# Patient Record
Sex: Female | Born: 1975 | Race: White | Hispanic: No | Marital: Married | Smoking: Current every day smoker
Health system: Southern US, Community
[De-identification: ages and names within clinical notes are randomized; demographics above are authoritative.]

## PROBLEM LIST (undated history)

## (undated) DIAGNOSIS — A4902 Methicillin resistant Staphylococcus aureus infection, unspecified site: Secondary | ICD-10-CM

## (undated) DIAGNOSIS — M549 Dorsalgia, unspecified: Secondary | ICD-10-CM

## (undated) DIAGNOSIS — E079 Disorder of thyroid, unspecified: Secondary | ICD-10-CM

## (undated) DIAGNOSIS — N200 Calculus of kidney: Secondary | ICD-10-CM

## (undated) HISTORY — PX: PARATHYROIDECTOMY: SHX19

## (undated) HISTORY — PX: HAND SURGERY: SHX662

## (undated) HISTORY — PX: TUBAL LIGATION: SHX77

---

## 2009-12-08 ENCOUNTER — Emergency Department (HOSPITAL_COMMUNITY): Admission: EM | Admit: 2009-12-08 | Discharge: 2009-12-08 | Payer: Self-pay | Admitting: Emergency Medicine

## 2009-12-09 ENCOUNTER — Emergency Department (HOSPITAL_COMMUNITY): Admission: EM | Admit: 2009-12-09 | Discharge: 2009-12-09 | Payer: Self-pay | Admitting: Emergency Medicine

## 2009-12-11 ENCOUNTER — Inpatient Hospital Stay (HOSPITAL_COMMUNITY): Admission: EM | Admit: 2009-12-11 | Discharge: 2009-12-13 | Payer: Self-pay | Admitting: Emergency Medicine

## 2010-08-25 LAB — CBC
HCT: 36.8 % (ref 36.0–46.0)
Hemoglobin: 12.6 g/dL (ref 12.0–15.0)
Hemoglobin: 12.6 g/dL (ref 12.0–15.0)
Hemoglobin: 14.8 g/dL (ref 12.0–15.0)
MCH: 31.2 pg (ref 26.0–34.0)
MCH: 31.3 pg (ref 26.0–34.0)
MCH: 31.5 pg (ref 26.0–34.0)
MCHC: 34.3 g/dL (ref 30.0–36.0)
MCV: 90.9 fL (ref 78.0–100.0)
MCV: 91.4 fL (ref 78.0–100.0)
MCV: 91.5 fL (ref 78.0–100.0)
Platelets: 243 10*3/uL (ref 150–400)
Platelets: 255 10*3/uL (ref 150–400)
RBC: 4.05 MIL/uL (ref 3.87–5.11)
RBC: 4.68 MIL/uL (ref 3.87–5.11)
RDW: 13.2 % (ref 11.5–15.5)
RDW: 13.3 % (ref 11.5–15.5)

## 2010-08-25 LAB — DIFFERENTIAL
Basophils Absolute: 0.1 10*3/uL (ref 0.0–0.1)
Basophils Relative: 0 % (ref 0–1)
Basophils Relative: 1 % (ref 0–1)
Eosinophils Relative: 1 % (ref 0–5)
Lymphs Abs: 1.9 10*3/uL (ref 0.7–4.0)
Monocytes Absolute: 0.6 10*3/uL (ref 0.1–1.0)
Neutro Abs: 9.1 10*3/uL — ABNORMAL HIGH (ref 1.7–7.7)
Neutrophils Relative %: 59 % (ref 43–77)
Neutrophils Relative %: 78 % — ABNORMAL HIGH (ref 43–77)

## 2010-08-25 LAB — COMPREHENSIVE METABOLIC PANEL
ALT: 44 U/L — ABNORMAL HIGH (ref 0–35)
Albumin: 2.8 g/dL — ABNORMAL LOW (ref 3.5–5.2)
Alkaline Phosphatase: 53 U/L (ref 39–117)
CO2: 23 mEq/L (ref 19–32)
Potassium: 4.7 mEq/L (ref 3.5–5.1)
Total Bilirubin: 0.5 mg/dL (ref 0.3–1.2)

## 2010-08-25 LAB — POCT I-STAT, CHEM 8
Calcium, Ion: 1.44 mmol/L — ABNORMAL HIGH (ref 1.12–1.32)
Glucose, Bld: 98 mg/dL (ref 70–99)
HCT: 45 % (ref 36.0–46.0)
Hemoglobin: 15.3 g/dL — ABNORMAL HIGH (ref 12.0–15.0)
Potassium: 4 mEq/L (ref 3.5–5.1)

## 2010-08-25 LAB — CULTURE, BLOOD (ROUTINE X 2): Culture: NO GROWTH

## 2010-12-24 ENCOUNTER — Emergency Department (HOSPITAL_COMMUNITY)
Admission: EM | Admit: 2010-12-24 | Discharge: 2010-12-24 | Disposition: A | Discharge status: Home / Self Care | Payer: Self-pay | Attending: Emergency Medicine | Admitting: Emergency Medicine

## 2010-12-24 ENCOUNTER — Emergency Department (HOSPITAL_COMMUNITY): Payer: Self-pay

## 2010-12-24 DIAGNOSIS — G5600 Carpal tunnel syndrome, unspecified upper limb: Secondary | ICD-10-CM | POA: Insufficient documentation

## 2010-12-24 DIAGNOSIS — M25539 Pain in unspecified wrist: Secondary | ICD-10-CM | POA: Insufficient documentation

## 2011-04-16 ENCOUNTER — Encounter: Payer: Self-pay | Admitting: *Deleted

## 2011-04-16 ENCOUNTER — Emergency Department (HOSPITAL_COMMUNITY): Payer: Self-pay

## 2011-04-16 ENCOUNTER — Emergency Department (HOSPITAL_COMMUNITY)
Admission: EM | Admit: 2011-04-16 | Discharge: 2011-04-16 | Disposition: A | Discharge status: Home / Self Care | Payer: Self-pay | Attending: Emergency Medicine | Admitting: Emergency Medicine

## 2011-04-16 DIAGNOSIS — H9209 Otalgia, unspecified ear: Secondary | ICD-10-CM | POA: Insufficient documentation

## 2011-04-16 DIAGNOSIS — J3489 Other specified disorders of nose and nasal sinuses: Secondary | ICD-10-CM | POA: Insufficient documentation

## 2011-04-16 DIAGNOSIS — R0602 Shortness of breath: Secondary | ICD-10-CM | POA: Insufficient documentation

## 2011-04-16 DIAGNOSIS — R059 Cough, unspecified: Secondary | ICD-10-CM | POA: Insufficient documentation

## 2011-04-16 DIAGNOSIS — J351 Hypertrophy of tonsils: Secondary | ICD-10-CM | POA: Insufficient documentation

## 2011-04-16 DIAGNOSIS — R599 Enlarged lymph nodes, unspecified: Secondary | ICD-10-CM | POA: Insufficient documentation

## 2011-04-16 DIAGNOSIS — Z8614 Personal history of Methicillin resistant Staphylococcus aureus infection: Secondary | ICD-10-CM | POA: Insufficient documentation

## 2011-04-16 DIAGNOSIS — Z9889 Other specified postprocedural states: Secondary | ICD-10-CM | POA: Insufficient documentation

## 2011-04-16 DIAGNOSIS — R062 Wheezing: Secondary | ICD-10-CM | POA: Insufficient documentation

## 2011-04-16 DIAGNOSIS — F17200 Nicotine dependence, unspecified, uncomplicated: Secondary | ICD-10-CM | POA: Insufficient documentation

## 2011-04-16 DIAGNOSIS — R05 Cough: Secondary | ICD-10-CM | POA: Insufficient documentation

## 2011-04-16 DIAGNOSIS — J40 Bronchitis, not specified as acute or chronic: Secondary | ICD-10-CM | POA: Insufficient documentation

## 2011-04-16 HISTORY — DX: Methicillin resistant Staphylococcus aureus infection, unspecified site: A49.02

## 2011-04-16 LAB — RAPID STREP SCREEN (MED CTR MEBANE ONLY): Streptococcus, Group A Screen (Direct): NEGATIVE

## 2011-04-16 MED ORDER — ALBUTEROL SULFATE HFA 108 (90 BASE) MCG/ACT IN AERS: 1.0000 | INHALATION_SPRAY | Freq: Four times a day (QID) | RESPIRATORY_TRACT | Status: DC | PRN

## 2011-04-16 MED ORDER — ALBUTEROL SULFATE (5 MG/ML) 0.5% IN NEBU
2.5000 mg | INHALATION_SOLUTION | RESPIRATORY_TRACT | Status: DC
  Administered 2011-04-16: 2.5 mg via RESPIRATORY_TRACT
  Filled 2011-04-16: qty 1

## 2011-04-16 MED ORDER — IPRATROPIUM BROMIDE 0.02 % IN SOLN
0.5000 mg | RESPIRATORY_TRACT | Status: DC
  Administered 2011-04-16: 0.5 mg via RESPIRATORY_TRACT
  Filled 2011-04-16: qty 2.5

## 2011-04-16 MED ORDER — AZITHROMYCIN 250 MG PO TABS: 250.0000 mg | ORAL_TABLET | Freq: Every day | ORAL | Status: AC

## 2011-04-16 NOTE — ED Notes (Signed)
Pt reports cough, congestion, sore throat, snuffy nose, and ear discomfort since Monday. Pt reports productive cough-yellow sputum.

## 2011-04-16 NOTE — ED Provider Notes (Addendum)
History     CSN: [id number] Arrival date & time: 04/16/2011  7:24 AM   First MD Initiated Contact with Patient 04/16/11 0725      Chief Complaint  Patient presents with  . Cough    HPI 35 with multiple complaints. Patient complaining of 3 days of rhinorrhea, nasal congestion,, bilateral ear pain right greater than left. She's had subjective fevers at home. Her husband has had similar sx over the past one week. Denies abdominal pain, nausea, vomiting. No h/o asthma or wheezing. Denies body aches. Denies other complaints today.    Past Medical History  Diagnosis Date  . MRSA (methicillin resistant Staphylococcus aureus)     Denies  Past Surgical History  Procedure Date  . Parathyroidectomy     Noncontributory  History reviewed. No pertinent family history.  History  Substance Use Topics  . Smoking status: Current Everyday Smoker    Types: Cigarettes  . Smokeless tobacco: Never Used  . Alcohol Use: No    OB History    Grav Para Term Preterm Abortions TAB SAB Ect Mult Living                  Review of Systems  All other systems reviewed and are negative.   Neg except as noted HPI  Allergies  Review of patient's allergies indicates no known allergies.  Home Medications   Current Outpatient Rx  Name Route Sig Dispense Refill  . OVER THE COUNTER MEDICATION Oral Take 2 tablets by mouth every 6 (six) hours as needed. Alka Seltzer Cold Tablets For cold symptoms      . ALBUTEROL SULFATE HFA 108 (90 BASE) MCG/ACT IN AERS Inhalation Inhale 1-2 puffs into the lungs every 6 (six) hours as needed for wheezing. 1 Inhaler 0  . AZITHROMYCIN 250 MG PO TABS Oral Take 1 tablet (250 mg total) by mouth daily. Take first 2 tablets together, then 1 every day until finished. 6 tablet 0    BP 112/76  Pulse 81  Temp(Src) 97.8 F (36.6 C) (Oral)  SpO2 97%  LMP 04/16/2011  Physical Exam  Nursing note and vitals reviewed. Constitutional: She is oriented to person, place, and  time. She appears well-developed.  HENT:  Head: Atraumatic.  Mouth/Throat: Oropharynx is clear and moist. No oropharyngeal exudate.       Mild posterior oropharynx erythema No exudates  Uvula midline 1+ tonsillar swelling b/l  R TM with middle ear effusion, Lt TM wnl Nasal congestion, rhinorrhea noted  Eyes: Conjunctivae and EOM are normal. Pupils are equal, round, and reactive to light.  Neck: Normal range of motion. Neck supple.       B/l submandibular LAD  Cardiovascular: Normal rate, regular rhythm, normal heart sounds and intact distal pulses.   Pulmonary/Chest: Effort normal and breath sounds normal. No respiratory distress. She has no wheezes. She has no rales.       L diffuse exp wheeze  Abdominal: Soft. She exhibits no distension. There is no tenderness. There is no rebound and no guarding.  Musculoskeletal: Normal range of motion.  Lymphadenopathy:    She has no cervical adenopathy.  Neurological: She is alert and oriented to person, place, and time.  Skin: Skin is warm and dry. No rash noted.  Psychiatric: She has a normal mood and affect.    ED Course  Procedures (including critical care time)   Labs Reviewed  RAPID STREP SCREEN   Dg Chest 2 View  04/16/2011  *RADIOLOGY REPORT*  Clinical Data:  Cough, shortness of breath  CHEST - 2 VIEW  Comparison: None.  Findings: Lungs clear.  Heart size and pulmonary vascularity normal.  No effusion.  Visualized bones unremarkable.  IMPRESSION: No acute disease  Original Report Authenticated By: Osa Craver, M.D.     1. Bronchitis       MDM  Likely bronchitis. XR eval pneumonia. Rapid strep. Duoneb. Anticipate discharge home  Stefano Gaul, MD  Wheezing improved. Feeling better. CXR negative for pna. Will treat for bronchitis- albuterol inh, z pack. PMD f/u         Forbes Cellar, MD 04/16/11 4098  Forbes Cellar, MD 04/16/11 [phone]

## 2011-04-16 NOTE — ED Notes (Signed)
RT aware for need for breathing tx

## 2011-05-02 ENCOUNTER — Emergency Department (HOSPITAL_COMMUNITY)
Admission: EM | Admit: 2011-05-02 | Discharge: 2011-05-03 | Disposition: A | Discharge status: Home / Self Care | Payer: Self-pay | Attending: Emergency Medicine | Admitting: Emergency Medicine

## 2011-05-02 ENCOUNTER — Encounter (HOSPITAL_COMMUNITY): Payer: Self-pay | Admitting: Emergency Medicine

## 2011-05-02 DIAGNOSIS — K029 Dental caries, unspecified: Secondary | ICD-10-CM | POA: Insufficient documentation

## 2011-05-02 DIAGNOSIS — K089 Disorder of teeth and supporting structures, unspecified: Secondary | ICD-10-CM | POA: Insufficient documentation

## 2011-05-02 NOTE — ED Notes (Signed)
The pt has rt jaw pain.  She has a tooth that is hurting and also she grinds her teeth at night naking it worse

## 2011-05-03 MED ORDER — TRAMADOL HCL 50 MG PO TABS: 50.0000 mg | ORAL_TABLET | Freq: Four times a day (QID) | ORAL | Status: DC | PRN

## 2011-05-03 MED ORDER — TRAMADOL HCL 50 MG PO TABS
50.0000 mg | ORAL_TABLET | Freq: Once | ORAL | Status: AC
  Administered 2011-05-03: 50 mg via ORAL
  Filled 2011-05-03: qty 1

## 2011-05-03 MED ORDER — TRAMADOL HCL 50 MG PO TABS: 50.0000 mg | ORAL_TABLET | Freq: Four times a day (QID) | ORAL | Status: AC | PRN

## 2011-05-03 NOTE — ED Provider Notes (Signed)
History     CSN: [id number] Arrival date & time: 05/02/2011 10:45 PM   First MD Initiated Contact with Patient 05/03/11 0020      Chief Complaint  Patient presents with  . Dental Pain    (Consider location/radiation/quality/duration/timing/severity/associated sxs/prior treatment) HPI Comments: Patient with a large cavity posterior lateral aspect of the second right lower molar without surrounding erythema  Patient is a 35 y.o. female presenting with tooth pain. The history is provided by the patient.  Dental PainThe primary symptoms include mouth pain. Primary symptoms do not include dental injury or oral lesions. The symptoms began more than 1 month ago. The symptoms are worsening. The symptoms occur intermittently.  Additional symptoms do not include: facial swelling.    Past Medical History  Diagnosis Date  . MRSA (methicillin resistant Staphylococcus aureus)     Past Surgical History  Procedure Date  . Parathyroidectomy   . Tubal ligation   . Hand surgery     No family history on file.  History  Substance Use Topics  . Smoking status: Current Everyday Smoker    Types: Cigarettes  . Smokeless tobacco: Never Used  . Alcohol Use: No    OB History    Grav Para Term Preterm Abortions TAB SAB Ect Mult Living                  Review of Systems  Constitutional: Negative.   HENT: Negative for facial swelling.   Eyes: Negative.   Respiratory: Negative.   Cardiovascular: Negative.   Gastrointestinal: Negative.   Musculoskeletal: Negative.   Neurological: Negative.   Hematological: Negative.   Psychiatric/Behavioral: Negative.     Allergies  Review of patient's allergies indicates no known allergies.  Home Medications   Current Outpatient Rx  Name Route Sig Dispense Refill  . BC HEADACHE POWDER PO Oral Take 1 packet by mouth 3 (three) times daily as needed. For pain       BP 121/79  Pulse 87  Temp(Src) 98.6 F (37 C) (Oral)  Resp 14  SpO2 99%   LMP 04/16/2011  Physical Exam  Constitutional: She is oriented to person, place, and time. She appears well-developed and well-nourished.  HENT:  Head: Normocephalic.  Mouth/Throat:    Eyes: EOM are normal.  Neck: Neck supple.  Cardiovascular: Regular rhythm.   Pulmonary/Chest: Breath sounds normal.  Musculoskeletal: Normal range of motion.  Neurological: She is oriented to person, place, and time.  Skin: Skin is warm.  Psychiatric: She has a normal mood and affect.    ED Course  Procedures (including critical care time)  Labs Reviewed - No data to display No results found.   1. Simple dental cavity       MDM  Cavity vs abscess    Medical screening examination/treatment/procedure(s) were performed by non-physician practitioner and as supervising physician I was immediately available for consultation/collaboration.    Arman Filter, NP 05/03/11 0100  Arman Filter, NP 05/03/11 4098  Sunnie Nielsen, MD 05/03/11 [phone]

## 2011-06-29 ENCOUNTER — Emergency Department (HOSPITAL_COMMUNITY): Payer: Self-pay

## 2011-06-29 ENCOUNTER — Emergency Department (HOSPITAL_COMMUNITY)
Admission: EM | Admit: 2011-06-29 | Discharge: 2011-06-30 | Disposition: A | Discharge status: Home / Self Care | Payer: Self-pay | Attending: Emergency Medicine | Admitting: Emergency Medicine

## 2011-06-29 DIAGNOSIS — M25519 Pain in unspecified shoulder: Secondary | ICD-10-CM | POA: Insufficient documentation

## 2011-06-29 DIAGNOSIS — M5380 Other specified dorsopathies, site unspecified: Secondary | ICD-10-CM | POA: Insufficient documentation

## 2011-06-29 DIAGNOSIS — M542 Cervicalgia: Secondary | ICD-10-CM | POA: Insufficient documentation

## 2011-06-29 MED ORDER — KETOROLAC TROMETHAMINE 60 MG/2ML IM SOLN
60.0000 mg | Freq: Once | INTRAMUSCULAR | Status: AC
  Administered 2011-06-29: 60 mg via INTRAMUSCULAR
  Filled 2011-06-29: qty 2

## 2011-06-29 MED ORDER — CYCLOBENZAPRINE HCL 5 MG PO TABS: 5.0000 mg | ORAL_TABLET | Freq: Three times a day (TID) | ORAL | Status: AC | PRN

## 2011-06-29 MED ORDER — OXYCODONE-ACETAMINOPHEN 5-325 MG PO TABS: 1.0000 | ORAL_TABLET | Freq: Four times a day (QID) | ORAL | Status: AC | PRN

## 2011-06-29 MED ORDER — PREDNISONE 10 MG PO TABS: 50.0000 mg | ORAL_TABLET | Freq: Every day | ORAL | Status: DC

## 2011-06-29 NOTE — ED Notes (Signed)
Pt has had pain in the back of her neck.  She states that the "bone hurts" and that the pain radiates 1 1/2 inches out when sitting, pt has attempted home remedies without any success.  Pain increases with movement.  No fever or chills, no HA with this.  Not associated with any trauma

## 2011-06-29 NOTE — ED Provider Notes (Signed)
History     CSN: [id number]  Arrival date & time 06/29/11  2138   First MD Initiated Contact with Patient 06/29/11 2152      Chief Complaint  Patient presents with  . Neck Pain    (Consider location/radiation/quality/duration/timing/severity/associated sxs/prior treatment) HPI  Neck Pain: Paitent complains of neck pain. Event that precipitate these symptoms: none known. Onset of symptoms several weeks ago, stable since that time. Current symptoms are stiffness in neck and shoulders. Patient denies numbness in or tingling in her neck, hands or legs. Patient has had no prior neck problems.  Previous treatments include: none and massage, heat and ice, iburpfen. She denies recent illness, weakness, headaches, change in vision, CP, SOB, chills, fevers.   Past Medical History  Diagnosis Date  . MRSA (methicillin resistant Staphylococcus aureus)     Past Surgical History  Procedure Date  . Parathyroidectomy   . Tubal ligation   . Hand surgery     No family history on file.  History  Substance Use Topics  . Smoking status: Current Everyday Smoker    Types: Cigarettes  . Smokeless tobacco: Never Used  . Alcohol Use: No    OB History    Grav Para Term Preterm Abortions TAB SAB Ect Mult Living                  Review of Systems  All other systems reviewed and are negative.    Allergies  Review of patient's allergies indicates no known allergies.  Home Medications   Current Outpatient Rx  Name Route Sig Dispense Refill  . CYCLOBENZAPRINE HCL 5 MG PO TABS Oral Take 1 tablet (5 mg total) by mouth 3 (three) times daily as needed for muscle spasms. 30 tablet 0  . OXYCODONE-ACETAMINOPHEN 5-325 MG PO TABS Oral Take 1 tablet by mouth every 6 (six) hours as needed for pain. 15 tablet 0  . PREDNISONE 10 MG PO TABS Oral Take 5 tablets (50 mg total) by mouth daily. 5 tablet 0    BP 115/64  Pulse 88  Temp(Src) 98.1 F (36.7 C) (Oral)  Resp 18  SpO2 100%  Physical Exam   Constitutional: She appears well-developed and well-nourished.  HENT:  Head: Normocephalic and atraumatic.  Eyes: Conjunctivae are normal. Pupils are equal, round, and reactive to light.  Neck: Trachea normal, normal range of motion and full passive range of motion without pain. Neck supple.  Cardiovascular: Normal rate, regular rhythm and normal pulses.   Pulmonary/Chest: Effort normal. Chest wall is not dull to percussion. She exhibits no tenderness, no crepitus, no edema, no deformity and no retraction.  Abdominal: Normal appearance.  Musculoskeletal:       Cervical back: She exhibits tenderness and spasm. She exhibits normal range of motion, no bony tenderness, no swelling, no edema, no deformity, no laceration, no pain and normal pulse.  Neurological: She is alert. She has normal strength.  Skin: Skin is warm, dry and intact.  Psychiatric: Her speech is normal. Cognition and memory are normal.    ED Course  Procedures (including critical care time)  Labs Reviewed - No data to display Dg Cervical Spine Complete  06/29/2011  *RADIOLOGY REPORT*  Clinical Data: Neck pain.  CERVICAL SPINE - COMPLETE 4+ VIEW  Comparison: None.  Findings: There is no evidence of fracture or subluxation. Vertebral bodies demonstrate normal height and alignment. Intervertebral disc spaces are preserved.  Prevertebral soft tissues are within normal limits.  The provided odontoid view demonstrates no significant  abnormality.  The visualized lung apices are clear.  Scattered clips are noted about the thyroid bed.  IMPRESSION: No evidence of fracture or subluxation along the cervical spine.  Original Report Authenticated By: Tonia Ghent, M.D.     1. Neck pain       MDM  Pt does not have meningeal signs or symptoms of a spinal cord process. Most likely musculoskeletal. Pt given prednisone, perc and flexeril. Also a referral to Ortho        Dorthula Matas, PA 06/29/11 2330

## 2011-06-29 NOTE — ED Provider Notes (Signed)
Medical screening examination/treatment/procedure(s) were performed by non-physician practitioner and as supervising physician I was immediately available for consultation/collaboration.   Dayton Bailiff, MD 06/29/11 [phone]

## 2011-08-30 ENCOUNTER — Emergency Department (HOSPITAL_COMMUNITY): Payer: Self-pay

## 2011-08-30 ENCOUNTER — Emergency Department (HOSPITAL_COMMUNITY)
Admission: EM | Admit: 2011-08-30 | Discharge: 2011-08-30 | Disposition: A | Discharge status: Home Health | Payer: Self-pay | Attending: Emergency Medicine | Admitting: Emergency Medicine

## 2011-08-30 ENCOUNTER — Encounter (HOSPITAL_COMMUNITY): Payer: Self-pay

## 2011-08-30 DIAGNOSIS — J984 Other disorders of lung: Secondary | ICD-10-CM | POA: Insufficient documentation

## 2011-08-30 DIAGNOSIS — M542 Cervicalgia: Secondary | ICD-10-CM | POA: Insufficient documentation

## 2011-08-30 DIAGNOSIS — F17200 Nicotine dependence, unspecified, uncomplicated: Secondary | ICD-10-CM | POA: Insufficient documentation

## 2011-08-30 DIAGNOSIS — R079 Chest pain, unspecified: Secondary | ICD-10-CM | POA: Insufficient documentation

## 2011-08-30 LAB — COMPREHENSIVE METABOLIC PANEL
ALT: 15 U/L (ref 0–35)
AST: 21 U/L (ref 0–37)
Albumin: 3.6 g/dL (ref 3.5–5.2)
Alkaline Phosphatase: 40 U/L (ref 39–117)
BUN: 10 mg/dL (ref 6–23)
CO2: 23 mEq/L (ref 19–32)
Calcium: 8.9 mg/dL (ref 8.4–10.5)
Chloride: 104 mEq/L (ref 96–112)
Creatinine, Ser: 0.78 mg/dL (ref 0.50–1.10)
GFR calc Af Amer: >90 mL/min (ref >90)
GFR calc non Af Amer: >90 mL/min (ref >90)
Glucose, Bld: 101 mg/dL — ABNORMAL HIGH (ref 70–99)
Potassium: 3.6 mEq/L (ref 3.5–5.1)
Sodium: 136 mEq/L (ref 135–145)
Total Bilirubin: 0.2 mg/dL — ABNORMAL LOW (ref 0.3–1.2)
Total Protein: 6.8 g/dL (ref 6.0–8.3)

## 2011-08-30 LAB — CBC
MCH: 30 pg (ref 26.0–34.0)
MCHC: 34.4 g/dL (ref 30.0–36.0)
MCV: 87.3 fL (ref 78.0–100.0)
Platelets: 281 10*3/uL (ref 150–400)
RDW: 13.2 % (ref 11.5–15.5)

## 2011-08-30 LAB — DIFFERENTIAL
Basophils Absolute: 0 10*3/uL (ref 0.0–0.1)
Basophils Relative: 0 % (ref 0–1)
Eosinophils Absolute: 0.1 10*3/uL (ref 0.0–0.7)
Eosinophils Relative: 1 % (ref 0–5)
Lymphs Abs: 2.4 10*3/uL (ref 0.7–4.0)

## 2011-08-30 MED ORDER — IOHEXOL 300 MG/ML  SOLN
100.0000 mL | Freq: Once | INTRAMUSCULAR | Status: AC | PRN
  Administered 2011-08-30: 100 mL via INTRAVENOUS

## 2011-08-30 NOTE — Discharge Instructions (Signed)
CT scan shows a 3 mm area in your right lower lung. Recommend stop smoking.  Also recommend followup CT scan of your chest in one year.  We do not see any abnormalities in the area surrounding the right clavicle. Calcium level normal. 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

## 2011-08-30 NOTE — ED Notes (Signed)
Patient reports that she is having pain up under right clavicle. 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

## 2011-08-30 NOTE — ED Notes (Signed)
Patient transported to CT 

## 2011-08-30 NOTE — ED Provider Notes (Signed)
History     CSN: [id number]  Arrival date & time 08/30/11  1453   First MD Initiated Contact with Patient 08/30/11 1506      Chief Complaint  Patient presents with  . 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 She is now in [place]. Her last calcium past summer was normal. She feels a sense of fullness in the medial infra-area of her right clavicle. She is eating well. No loss of weight, fever, chills. Smokes cigarettes. Otherwise negative past medical history. No medications. She is concerned that there is a recurrent mass in that area   Past Medical History  Diagnosis Date  . MRSA (methicillin resistant Staphylococcus aureus)     Past Surgical History  Procedure Date  . Parathyroidectomy   . Tubal ligation   . Hand surgery     No family history on file.  History  Substance Use Topics  . Smoking status: Current Everyday Smoker    Types: Cigarettes  . Smokeless tobacco: Never Used  . Alcohol Use: No    OB History    Grav Para Term Preterm Abortions TAB SAB Ect Mult Living                  Review of Systems  Unable to perform ROS   Allergies  Review of patient's allergies indicates no known allergies.  Home Medications   Current Outpatient Rx  Name Route Sig Dispense Refill  . IBUPROFEN 200 MG PO TABS Oral Take 800 mg by mouth every 6 (six) hours as needed. For pain      BP 107/67  Pulse 76  Temp 97.8 F (36.6 C)  Resp 22  SpO2 100%  Physical Exam  Nursing note and vitals reviewed. Constitutional: She is oriented to person, place, and time. She appears well-developed and well-nourished.  HENT:  Head: Normocephalic and atraumatic.  Eyes: Conjunctivae and EOM are normal. Pupils are equal, round, and reactive to light.  Neck: Normal  range of motion. Neck supple.       Surgical scar horizontally in the inferior aspect of the neck. No thyroid fullness  Cardiovascular: Normal rate and regular rhythm.   Pulmonary/Chest: Effort normal and breath sounds normal.       Slight fullness and tenderness posterior inferior and medial to the right clavicle.  Abdominal: Soft. Bowel sounds are normal.  Musculoskeletal: Normal range of motion.  Neurological: She is alert and oriented to person, place, and time.  Skin: Skin is warm and dry.  Psychiatric: She has a normal mood and affect.    ED Course  Procedures (including critical care time)  Labs Reviewed  COMPREHENSIVE METABOLIC PANEL - Abnormal; Notable for the following:    Glucose, Bld 101 (*)    Total Bilirubin 0.2 (*)    All other components within normal limits  CBC  DIFFERENTIAL  Ct Chest W Contrast  08/30/2011  *RADIOLOGY REPORT*  Clinical Data: History of right parathyroidectomy in June, 2012, in [place].  Palpable fullness in the infraclavicular region on the right.  CT CHEST WITH CONTRAST 01/2012:  Technique:  Multidetector CT imaging of the chest was performed following the standard protocol during bolus administration of intravenous contrast.  Contrast:  100 ml Omnipaque-300 IV.  Comparison: None.  Findings: No mass or lymphadenopathy in the medial right infraclavicular region.  No significant degenerative  change in the right sternoclavicular joint.  Surgical clips posterior to the right thyroid gland at the site of prior parathyroid resection, and surgical clips in the superior mediastinum.  Visualized thyroid gland unremarkable.  Residual thymic tissue in the anterior- superior mediastinum.  Calcified right hilar lymph nodes.  No significant mediastinal, hilar, or axillary lymphadenopathy.  Solitary 3 mm noncalcified nodule in the superior segment right lower lobe (series 5, image 30).  Pulmonary parenchyma otherwise clear apart from the expected dependent atelectasis  posteriorly in the lower lobes.  No localized airspace consolidation.  No pleural effusions.  Heart size normal.  No pericardial effusion.  No visible coronary calcification.  Visualized upper abdomen unremarkable for the early portal venous phase of enhancement.  Bone window images unremarkable.  IMPRESSION:  1.  No mass or lymphadenopathy in the medial right infraclavicular region to correspond to the palpable abnormality. 2.  Solitary 3 mm nodule in the superior segment right lower lobe. If the patient is at high risk for bronchogenic carcinoma, follow- up chest CT at 1 year is recommended.  If the patient is at low risk, no follow-up is needed.  This recommendation follows the consensus statement: Guidelines for Management of Small Pulmonary Nodules Detected on CT Scans:  A Statement from the Fleischner Society as published in Radiology 2005; 237:395-400. 3.  No acute cardiopulmonary disease.  Calcified right hilar lymph nodes consistent with old granulomatous disease; this may also explain the 3 mm nodule described above.  Original Report Authenticated By: Arnell Sieving, M.D.   No results found.   No diagnosis found.    MDM  Will check calcium. Dust diagnostic testing the radiologist. He recommended a CT scan of chest with contrast.  Recheck at 1730:  Results of CT scan discussed with patient and husband. Calcium normal. I suggested a followup CT scan of chest in one year  since patient is smoker.  They understand and agree. We will attempt to get primary care doctor in [place] to followup on her thyroid disease and calcium levels      Donnetta Hutching, MD 08/30/11 1733

## 2011-09-28 ENCOUNTER — Emergency Department (HOSPITAL_COMMUNITY)
Admission: EM | Admit: 2011-09-28 | Discharge: 2011-09-28 | Disposition: A | Discharge status: Home / Self Care | Payer: Self-pay | Attending: Emergency Medicine | Admitting: Emergency Medicine

## 2011-09-28 ENCOUNTER — Encounter (HOSPITAL_COMMUNITY): Payer: Self-pay | Admitting: Emergency Medicine

## 2011-09-28 DIAGNOSIS — M545 Low back pain, unspecified: Secondary | ICD-10-CM | POA: Insufficient documentation

## 2011-09-28 DIAGNOSIS — M549 Dorsalgia, unspecified: Secondary | ICD-10-CM

## 2011-09-28 MED ORDER — DIAZEPAM 5 MG/ML IJ SOLN
5.0000 mg | Freq: Once | INTRAMUSCULAR | Status: AC
  Administered 2011-09-28: 5 mg via INTRAMUSCULAR
  Filled 2011-09-28: qty 2

## 2011-09-28 MED ORDER — DIAZEPAM 5 MG PO TABS: 5.0000 mg | ORAL_TABLET | Freq: Two times a day (BID) | ORAL | Status: AC

## 2011-09-28 MED ORDER — IBUPROFEN 800 MG PO TABS: 800.0000 mg | ORAL_TABLET | Freq: Three times a day (TID) | ORAL | Status: AC

## 2011-09-28 NOTE — Discharge Instructions (Signed)
Use conservative methods at home including heat therapy and cold therapy as we discussed. More information on cold therapy is listed below.  It is not recommended to use heat treatment directly after an acute injury.  Take muscle relaxer as prescribed.  Do not drive or operate heavy machinery while taking this medication.  SEEK IMMEDIATE MEDICAL ATTENTION IF: New numbness, tingling, weakness, or problem with the use of your arms or legs.  Severe back pain not relieved with medications.  Change in bowel or bladder control.  Increasing pain in any areas of the body (such as chest or abdominal pain).  Shortness of breath, dizziness or fainting.  Nausea (feeling sick to your stomach), vomiting, fever, or sweats.  COLD THERAPY DIRECTIONS:  Ice or gel packs can be used to reduce both pain and swelling. Ice is the most helpful within the first 24 to 48 hours after an injury or flareup from overusing a muscle or joint.  Ice is effective, has very few side effects, and is safe for most people to use.   If you expose your skin to cold temperatures for too long or without the proper protection, you can damage your skin or nerves. Watch for signs of skin damage due to cold.   HOME CARE INSTRUCTIONS  Follow these tips to use ice and cold packs safely.  Place a dry or damp towel between the ice and skin. A damp towel will cool the skin more quickly, so you may need to shorten the time that the ice is used.  For a more rapid response, add gentle compression to the ice.  Ice for no more than 10 to 20 minutes at a time. The bonier the area you are icing, the less time it will take to get the benefits of ice.  Check your skin after 5 minutes to make sure there are no signs of a poor response to cold or skin damage.  Rest 20 minutes or more in between uses.  Once your skin is numb, you can end your treatment. You can test numbness by very lightly touching your skin. The touch should be so light that you do not  see the skin dimple from the pressure of your fingertip. When using ice, most people will feel these normal sensations in this order: cold, burning, aching, and numbness.  Do not use ice on someone who cannot communicate their responses to pain, such as small children or people with dementia.   HOW TO MAKE AN ICE PACK  To make an ice pack, do one of the following:  Place crushed ice or a bag of frozen vegetables in a sealable plastic bag. Squeeze out the excess air. Place this bag inside another plastic bag. Slide the bag into a pillowcase or place a damp towel between your skin and the bag.  Mix 3 parts water with 1 part rubbing alcohol. Freeze the mixture in a sealable plastic bag. When you remove the mixture from the freezer, it will be slushy. Squeeze out the excess air. Place this bag inside another plastic bag. Slide the bag into a pillowcase or place a damp towel between your skin and the bag.   SEEK MEDICAL CARE IF:  You develop white spots on your skin. This may give the skin a blotchy (mottled) appearance.  Your skin turns blue or pale.  Your skin becomes waxy or hard.  Your swelling gets worse.  MAKE SURE YOU:  Understand these instructions.  Will watch your condition.  Will get help right away if you are not doing well or get worse.     In the event of an acute medical condition exists and the emergency physician feels it is necessary that the patient be given a narcotic or sedating medication -  a responsible adult driver should be present in the room prior to the medication being given by the nurse.   Prescriptions for narcotic or sedating medications that have been lost, stolen or expired will not be refilled in the Emergency Department.

## 2011-09-28 NOTE — ED Provider Notes (Signed)
History     CSN: [id number]  Arrival date & time 09/28/11  1225   First MD Initiated Contact with Patient 09/28/11 1243      Chief Complaint  Patient presents with  . Back Pain    (Consider location/radiation/quality/duration/timing/severity/associated sxs/prior treatment) HPI Comments: Patient comes in today with a chief complaint of lower back pain.  She reports that the pain is located across her entire lower back.  She states that the pain feels like a spasm.  The pain does not radiate.  She reports that she was doing yard work yesterday and doing a lot of bending.  She reports that her back was somewhat sore and stiff last evening and then the pain became worse this morning.  No prior history of back pain.  No trauma.  She reports that she took Naproxen for the pain, which gave her mild relief.  She denies any prior history of cancer or IVDU.  She denies any numbness/tingling.  Denies bowel or bladder incontinence.  Denies urinary retention.  Denies fever/chills.    The history is provided by the patient.    Past Medical History  Diagnosis Date  . MRSA (methicillin resistant Staphylococcus aureus)     Past Surgical History  Procedure Date  . Parathyroidectomy   . Tubal ligation   . Hand surgery     History reviewed. No pertinent family history.  History  Substance Use Topics  . Smoking status: Current Everyday Smoker    Types: Cigarettes  . Smokeless tobacco: Never Used  . Alcohol Use: No    OB History    Grav Para Term Preterm Abortions TAB SAB Ect Mult Living                  Review of Systems  Constitutional: Negative for fever and chills.  Genitourinary: Negative for dysuria, hematuria and decreased urine volume.       Denies bowel or bladder incontinence  Musculoskeletal: Positive for back pain.       Pain with ambulation  Skin: Negative for rash.  Neurological: Negative for numbness.    Allergies  Review of patient's allergies indicates no known  allergies.  Home Medications   Current Outpatient Rx  Name Route Sig Dispense Refill  . NAPROXEN 375 MG PO TABS Oral Take 750 mg by mouth 2 (two) times daily as needed. For pain.    Marland Kitchen TRAMADOL HCL 50 MG PO TABS Oral Take 50 mg by mouth every 6 (six) hours as needed. For pain.      BP 106/57  Pulse 82  Temp(Src) 98 F (36.7 C) (Oral)  Resp 18  SpO2 99%  LMP 09/09/2011  Physical Exam  Nursing note and vitals reviewed. Constitutional: She is oriented to person, place, and time. She appears well-developed and well-nourished. No distress.  HENT:  Head: Normocephalic and atraumatic.  Neck: Normal range of motion and full passive range of motion without pain. Neck supple. No spinous process tenderness and no muscular tenderness present. No rigidity. Normal range of motion present.  Cardiovascular: Normal rate, regular rhythm and intact distal pulses.  Exam reveals no gallop and no friction rub.   No murmur heard. Pulmonary/Chest: Effort normal and breath sounds normal. No respiratory distress. She has no wheezes. She has no rales. She exhibits no tenderness.  Musculoskeletal:       Cervical back: She exhibits normal range of motion, no tenderness, no bony tenderness and no pain.       Thoracic back:  She exhibits no tenderness, no bony tenderness and no pain.       Lumbar back: She exhibits tenderness, bony tenderness and pain. She exhibits no spasm and normal pulse.       Bilateral lower extremities nontender without color change, baseline range of motion of extremities.   Pt has increased pain w ROM of lumbar spine. Pain w ambulation, no sign of ataxia.  Neurological: She is alert and oriented to person, place, and time. She has normal strength and normal reflexes. No sensory deficit. Gait (no ataxia, slowed and hunched d/t pain ) abnormal.       sensation at baseline for light touch in all 4 distal extremities, motor symmetric & bilateral 5/5  Abduction,adduction,flexion of hips, knee  flexion & extension, foot dorsiflexion, foot plantar flexion.  Skin: Skin is warm and dry. No rash noted. She is not diaphoretic. No erythema. No pallor.  Psychiatric: She has a normal mood and affect.    ED Course  Procedures (including critical care time)  Labs Reviewed - No data to display No results found.   No diagnosis found.  Pain improved somewhat after Valium IM  MDM  Patient with back pain.  Suspect pain is muscular.  No neurological deficits and normal neuro exam.  Patient can walk but states is painful.  No loss of bowel or bladder control.  No concern for cauda equina.  No fever, night sweats, weight loss, h/o cancer, IVDU.  RICE protocol and pain medicine indicated and discussed with patient.         Pascal Lux [employer], PA-C 09/28/11 2259

## 2011-09-28 NOTE — ED Notes (Signed)
Pt c/o lower back pain since yesterday, denies known injury, able to ambulate without difficulty.

## 2011-09-29 NOTE — ED Provider Notes (Signed)
Medical screening examination/treatment/procedure(s) were performed by non-physician practitioner and as supervising physician I was immediately available for consultation/collaboration.    Nelia Shi, MD 09/29/11 [phone]

## 2012-01-25 ENCOUNTER — Emergency Department (HOSPITAL_COMMUNITY)
Admission: EM | Admit: 2012-01-25 | Discharge: 2012-01-25 | Disposition: A | Discharge status: Home / Self Care | Payer: Self-pay | Attending: Emergency Medicine | Admitting: Emergency Medicine

## 2012-01-25 ENCOUNTER — Encounter (HOSPITAL_COMMUNITY): Payer: Self-pay | Admitting: Emergency Medicine

## 2012-01-25 DIAGNOSIS — Z8614 Personal history of Methicillin resistant Staphylococcus aureus infection: Secondary | ICD-10-CM | POA: Insufficient documentation

## 2012-01-25 DIAGNOSIS — F17200 Nicotine dependence, unspecified, uncomplicated: Secondary | ICD-10-CM | POA: Insufficient documentation

## 2012-01-25 DIAGNOSIS — M545 Low back pain, unspecified: Secondary | ICD-10-CM | POA: Insufficient documentation

## 2012-01-25 DIAGNOSIS — M549 Dorsalgia, unspecified: Secondary | ICD-10-CM

## 2012-01-25 MED ORDER — HYDROCODONE-ACETAMINOPHEN 5-325 MG PO TABS: 2.0000 | ORAL_TABLET | ORAL | Status: AC | PRN

## 2012-01-25 MED ORDER — KETOROLAC TROMETHAMINE 60 MG/2ML IM SOLN
60.0000 mg | Freq: Once | INTRAMUSCULAR | Status: AC
  Administered 2012-01-25: 60 mg via INTRAMUSCULAR
  Filled 2012-01-25: qty 2

## 2012-01-25 MED ORDER — DIAZEPAM 5 MG PO TABS: 5.0000 mg | ORAL_TABLET | Freq: Two times a day (BID) | ORAL | Status: AC

## 2012-01-25 MED ORDER — DIAZEPAM 5 MG PO TABS
5.0000 mg | ORAL_TABLET | Freq: Once | ORAL | Status: AC
  Administered 2012-01-25: 5 mg via ORAL
  Filled 2012-01-25: qty 1

## 2012-01-25 NOTE — ED Notes (Signed)
Pt alert, nad, arrives from home, c/o neck pain and low back pain, denies recent trauma or injury, ambulates to triage, steady gait noted, resp even unlabored, skin pwd

## 2012-01-26 NOTE — ED Provider Notes (Signed)
History     CSN: [id number]  Arrival date & time 01/25/12  1752   None     Chief Complaint  Patient presents with  . Torticollis  . Back Pain    (Consider location/radiation/quality/duration/timing/severity/associated sxs/prior treatment) HPI Comments: Patient reports onset of back pain that started yesterday. She reports gradual onset and seems to correlate with her menstrual cycle which started yesterday. She reports the pain as cramping in nature, localized to her lower back and moderate. She is concerned because she does not usually have back pain with her menstrual cycle. She denies any neurologic symptoms, loss of control of bowel or bladder, numbness and tingling. She denies any injury. She has not taken anything for pain.   Patient is a 36 y.o. female presenting with back pain.  Back Pain  Pertinent negatives include no chest pain, no fever, no headaches, no abdominal pain, no dysuria and no weakness.    Past Medical History  Diagnosis Date  . MRSA (methicillin resistant Staphylococcus aureus)     Past Surgical History  Procedure Date  . Parathyroidectomy   . Tubal ligation   . Hand surgery     No family history on file.  History  Substance Use Topics  . Smoking status: Current Everyday Smoker    Types: Cigarettes  . Smokeless tobacco: Never Used  . Alcohol Use: No    OB History    Grav Para Term Preterm Abortions TAB SAB Ect Mult Living                  Review of Systems  Constitutional: Negative for fever, chills, diaphoresis and fatigue.  Respiratory: Negative for shortness of breath and wheezing.   Cardiovascular: Negative for chest pain.  Gastrointestinal: Negative for nausea, vomiting, abdominal pain, diarrhea and constipation.  Genitourinary: Negative for dysuria, vaginal discharge, difficulty urinating and dyspareunia.  Musculoskeletal: Positive for back pain.  Skin: Negative for rash and wound.  Neurological: Negative for dizziness, weakness  and headaches.    Allergies  Review of patient's allergies indicates no known allergies.  Home Medications   Current Outpatient Rx  Name Route Sig Dispense Refill  . ACETAMINOPHEN 325 MG PO TABS Oral Take 1,300 mg by mouth once. PAIN    . TRAMADOL HCL 50 MG PO TABS Oral Take 50 mg by mouth every 6 (six) hours as needed. For pain.    Marland Kitchen DIAZEPAM 5 MG PO TABS Oral Take 1 tablet (5 mg total) by mouth 2 (two) times daily. 10 tablet 0  . HYDROCODONE-ACETAMINOPHEN 5-325 MG PO TABS Oral Take 2 tablets by mouth every 4 (four) hours as needed for pain. 15 tablet 0    BP 110/71  Pulse 73  Temp 98.5 F (36.9 C) (Oral)  Resp 16  Ht 5\' 9"  (1.753 m)  Wt 190 lb (86.183 kg)  BMI 28.06 kg/m2  SpO2 100%  LMP 01/25/2012  Physical Exam  Nursing note and vitals reviewed. Constitutional: She is oriented to person, place, and time. She appears well-developed and well-nourished. No distress.  HENT:  Head: Normocephalic and atraumatic.  Mouth/Throat: No oropharyngeal exudate.  Eyes: Conjunctivae are normal. No scleral icterus.  Neck: Normal range of motion.  Cardiovascular: Normal rate and regular rhythm.  Exam reveals no gallop and no friction rub.   No murmur heard. Pulmonary/Chest: Effort normal. No respiratory distress. She has no wheezes. She has no rales. She exhibits no tenderness.  Abdominal: Soft. There is no tenderness.  Musculoskeletal: Normal range of motion.  She exhibits no edema and no tenderness.  Neurological: She is alert and oriented to person, place, and time. No cranial nerve deficit.       Extremity sensation and strength equal and intact bilaterally.   Skin: Skin is warm and dry. She is not diaphoretic.  Psychiatric: She has a normal mood and affect. Her behavior is normal.    ED Course  Procedures (including critical care time)  Labs Reviewed - No data to display No results found.   1. Back pain       MDM  1:22 AM  Patient is most likely having musculoskeletal  back pain due to exacerbation with movement and alleviated by rest. No neurologic symptoms and no injury leaves an acute emergency low likeliness. Patient prescribed valium and norco for pain. No further evaluation needed at this time. She should return for concerning or worsening symptoms.        Emilia Beck, PA-C 01/26/12 0124

## 2012-01-27 NOTE — ED Provider Notes (Signed)
Medical screening examination/treatment/procedure(s) were performed by non-physician practitioner and as supervising physician I was immediately available for consultation/collaboration.    Jeanene Mena L Glenora Morocho, MD 01/27/12 2250 

## 2012-02-20 ENCOUNTER — Encounter (HOSPITAL_COMMUNITY): Payer: Self-pay

## 2012-02-20 ENCOUNTER — Emergency Department (HOSPITAL_COMMUNITY)
Admission: EM | Admit: 2012-02-20 | Discharge: 2012-02-20 | Disposition: A | Discharge status: Home / Self Care | Payer: Self-pay | Attending: Emergency Medicine | Admitting: Emergency Medicine

## 2012-02-20 DIAGNOSIS — G8929 Other chronic pain: Secondary | ICD-10-CM | POA: Insufficient documentation

## 2012-02-20 DIAGNOSIS — M25529 Pain in unspecified elbow: Secondary | ICD-10-CM | POA: Insufficient documentation

## 2012-02-20 DIAGNOSIS — F17200 Nicotine dependence, unspecified, uncomplicated: Secondary | ICD-10-CM | POA: Insufficient documentation

## 2012-02-20 MED ORDER — TRAMADOL HCL 50 MG PO TABS: 50.0000 mg | ORAL_TABLET | Freq: Four times a day (QID) | ORAL | Status: AC | PRN

## 2012-02-20 NOTE — ED Provider Notes (Signed)
History     CSN: [id number]  Arrival date & time 02/20/12  0807   First MD Initiated Contact with Patient 02/20/12 0810      No chief complaint on file.   (Consider location/radiation/quality/duration/timing/severity/associated sxs/prior treatment) HPI  36 y.o. female reports exacerbation of chronic left elbow pain worsening over the course of 2 months. 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 She also has a hairline fracture of the olecranon, multiple remote lacerations to left palm, and anterior carpal tunnel syndrome. Patient's pain is 7/10 and exacerbated with movement and coldness this is typical to her prior injuries no new injuries reported. States that she is presenting today for pain control because she is uninsured. Patient has been taking naproxen at home with minimal relief.  Past Medical History  Diagnosis Date  . MRSA (methicillin resistant Staphylococcus aureus)     Past Surgical History  Procedure Date  . Parathyroidectomy   . Tubal ligation   . Hand surgery     No family history on file.  History  Substance Use Topics  . Smoking status: Current Every Day Smoker    Types: Cigarettes  . Smokeless tobacco: Never Used  . Alcohol Use: No    OB History    Grav Para Term Preterm Abortions TAB SAB Ect Mult Living                  Review of Systems  Allergies  Review of patient's allergies indicates no known allergies.  Home Medications   Current Outpatient Rx  Name Route Sig Dispense Refill  . ACETAMINOPHEN 325 MG PO TABS Oral Take 1,300 mg by mouth once. PAIN    . TRAMADOL HCL 50 MG PO TABS Oral Take 50 mg by mouth every 6 (six) hours as needed. For pain.      LMP 01/25/2012  Physical Exam  Nursing note and vitals reviewed. Constitutional: She is oriented to person, place, and time. She appears well-developed and well-nourished. No distress.  HENT:  Head: Normocephalic.  Eyes: Conjunctivae normal and EOM are normal.   Cardiovascular: Normal rate.   Pulmonary/Chest: Effort normal. No stridor.  Musculoskeletal: Normal range of motion.       Full range of motion of left shoulder, elbow, wrist and 5 digits. 2+ radial pulses bilaterally. Sensation intact to soft touch and pinprick. 5 out of 5 strength in all major joints. No swelling. Diffusely tender to palpation from olecranon the metacarpals. patient cannot identify one area that is worse than another.  Neurological: She is alert and oriented to person, place, and time.  Psychiatric: She has a normal mood and affect.    ED Course  Procedures (including critical care time)  Labs Reviewed - No data to display No results found.   1. Elbow pain, chronic       MDM  Exacerbation of chronic elbow pain. I advised patient that I would give her a prescription for pain control medication however she would not receive another one from the emergency room in the future. Patient voiced understanding and stated she would follow as an outpatient with primary care orthopedist.        Wynetta Emery, PA-C 02/20/12 [phone]

## 2012-02-20 NOTE — ED Notes (Signed)
Due to an old injury, patient has had pain in her left elbow.

## 2012-02-20 NOTE — ED Provider Notes (Signed)
Medical screening examination/treatment/procedure(s) were performed by non-physician practitioner and as supervising physician I was immediately available for consultation/collaboration.   Jesselee Poth, MD 02/20/12 1745 

## 2012-04-12 ENCOUNTER — Emergency Department (HOSPITAL_COMMUNITY)
Admission: EM | Admit: 2012-04-12 | Discharge: 2012-04-12 | Disposition: A | Discharge status: Home / Self Care | Payer: Self-pay | Attending: Emergency Medicine | Admitting: Emergency Medicine

## 2012-04-12 ENCOUNTER — Emergency Department (HOSPITAL_COMMUNITY): Payer: Self-pay

## 2012-04-12 DIAGNOSIS — R296 Repeated falls: Secondary | ICD-10-CM | POA: Insufficient documentation

## 2012-04-12 DIAGNOSIS — S8990XA Unspecified injury of unspecified lower leg, initial encounter: Secondary | ICD-10-CM | POA: Insufficient documentation

## 2012-04-12 DIAGNOSIS — M25579 Pain in unspecified ankle and joints of unspecified foot: Secondary | ICD-10-CM | POA: Insufficient documentation

## 2012-04-12 DIAGNOSIS — Z8614 Personal history of Methicillin resistant Staphylococcus aureus infection: Secondary | ICD-10-CM | POA: Insufficient documentation

## 2012-04-12 DIAGNOSIS — S99919A Unspecified injury of unspecified ankle, initial encounter: Secondary | ICD-10-CM | POA: Insufficient documentation

## 2012-04-12 DIAGNOSIS — F17200 Nicotine dependence, unspecified, uncomplicated: Secondary | ICD-10-CM | POA: Insufficient documentation

## 2012-04-12 DIAGNOSIS — Y92009 Unspecified place in unspecified non-institutional (private) residence as the place of occurrence of the external cause: Secondary | ICD-10-CM | POA: Insufficient documentation

## 2012-04-12 DIAGNOSIS — Y939 Activity, unspecified: Secondary | ICD-10-CM | POA: Insufficient documentation

## 2012-04-12 MED ORDER — OXYCODONE-ACETAMINOPHEN 5-325 MG PO TABS
1.0000 | ORAL_TABLET | Freq: Once | ORAL | Status: AC
  Administered 2012-04-12: 1 via ORAL
  Filled 2012-04-12: qty 1

## 2012-04-12 MED ORDER — OXYCODONE-ACETAMINOPHEN 5-325 MG PO TABS: 1.0000 | ORAL_TABLET | ORAL | Status: DC | PRN

## 2012-04-12 MED ORDER — IBUPROFEN 200 MG PO TABS
600.0000 mg | ORAL_TABLET | Freq: Once | ORAL | Status: AC
  Administered 2012-04-12: 600 mg via ORAL
  Filled 2012-04-12: qty 3

## 2012-04-12 MED ORDER — IBUPROFEN 600 MG PO TABS: 600.0000 mg | ORAL_TABLET | Freq: Four times a day (QID) | ORAL | Status: DC | PRN

## 2012-04-12 NOTE — ED Provider Notes (Signed)
Medical screening examination/treatment/procedure(s) were performed by non-physician practitioner and as supervising physician I was immediately available for consultation/collaboration.   Talbert Trembath, MD 04/12/12 2307 

## 2012-04-12 NOTE — ED Provider Notes (Signed)
History     CSN: [id number]  Arrival date & time 04/12/12  1710   None     Chief Complaint  Patient presents with  . Leg Pain    (Consider location/radiation/quality/duration/timing/severity/associated sxs/prior treatment) Patient is a 36 y.o. female presenting with leg pain. The history is provided by the patient. No language interpreter was used.  Leg Pain  The incident occurred 3 to 5 hours ago. The incident occurred at home. The pain is present in the left ankle. The quality of the pain is described as throbbing. Associated symptoms include numbness. Pertinent negatives include no loss of motion and no loss of sensation. She reports no foreign bodies present. She has tried nothing for the symptoms.   36 year old fell presents to the ER after getting tangled up in her dog's chain at route around both of her ankles and she fell. Swelling noted to the left ankle. Patient is walking with a limp.  Past Medical History  Diagnosis Date  . MRSA (methicillin resistant Staphylococcus aureus)     Past Surgical History  Procedure Date  . Parathyroidectomy   . Tubal ligation   . Hand surgery     No family history on file.  History  Substance Use Topics  . Smoking status: Current Every Day Smoker    Types: Cigarettes  . Smokeless tobacco: Never Used  . Alcohol Use: No    OB History    Grav Para Term Preterm Abortions TAB SAB Ect Mult Living                  Review of Systems  Constitutional: Negative.   HENT: Negative.   Eyes: Negative.   Respiratory: Negative.   Cardiovascular: Negative.   Gastrointestinal: Negative.   Musculoskeletal: Positive for gait problem.       L ankle pain  Neurological: Positive for numbness. Negative for weakness.  Psychiatric/Behavioral: Negative.   All other systems reviewed and are negative.    Allergies  Review of patient's allergies indicates no known allergies.  Home Medications  No current outpatient prescriptions on  file.  BP 117/72  Pulse 84  Temp 98.3 F (36.8 C) (Oral)  Resp 16  SpO2 99%  LMP 04/12/2012  Physical Exam  Nursing note and vitals reviewed. Constitutional: She is oriented to person, place, and time. She appears well-developed and well-nourished.  HENT:  Head: Normocephalic and atraumatic.  Eyes: Conjunctivae normal and EOM are normal. Pupils are equal, round, and reactive to light.  Neck: Normal range of motion. Neck supple.  Cardiovascular: Normal rate.   Pulmonary/Chest: Effort normal.  Abdominal: Soft.  Musculoskeletal: Normal range of motion. She exhibits tenderness. She exhibits no edema.       Left ankle tenderness.  Neurological: She is alert and oriented to person, place, and time. She has normal reflexes.  Skin: Skin is warm and dry.  Psychiatric: She has a normal mood and affect.    ED Course  Procedures (including critical care time)  Labs Reviewed - No data to display No results found.   No diagnosis found.    MDM  Left ankle tenderness after getting tangled up in the dog strength today. X-rays of the left ankle are negative for fracture. Patient will followup with her PCP of choice. Patient is to return to the ER for severe pain or worsening symptoms. She will ice and elevate for the next 24 hours use ibuprofen. For severe pain she will use the Percocet but she will not drive  with this medication.       Remi Haggard, NP 04/12/12 2044

## 2012-04-12 NOTE — ED Notes (Signed)
Pt states that she was getting her dog untangled from a tree and the chain wrapped around her left leg knocking her down. Now c/o L leg pain and lower back pain. NAD.

## 2012-08-07 ENCOUNTER — Emergency Department (HOSPITAL_COMMUNITY): Payer: Self-pay

## 2012-08-07 ENCOUNTER — Encounter (HOSPITAL_COMMUNITY): Payer: Self-pay | Admitting: *Deleted

## 2012-08-07 ENCOUNTER — Emergency Department (HOSPITAL_COMMUNITY)
Admission: EM | Admit: 2012-08-07 | Discharge: 2012-08-08 | Disposition: A | Discharge status: Home / Self Care | Payer: Self-pay | Attending: Emergency Medicine | Admitting: Emergency Medicine

## 2012-08-07 DIAGNOSIS — X58XXXA Exposure to other specified factors, initial encounter: Secondary | ICD-10-CM | POA: Insufficient documentation

## 2012-08-07 DIAGNOSIS — Z87442 Personal history of urinary calculi: Secondary | ICD-10-CM | POA: Insufficient documentation

## 2012-08-07 DIAGNOSIS — Y929 Unspecified place or not applicable: Secondary | ICD-10-CM | POA: Insufficient documentation

## 2012-08-07 DIAGNOSIS — Z8614 Personal history of Methicillin resistant Staphylococcus aureus infection: Secondary | ICD-10-CM | POA: Insufficient documentation

## 2012-08-07 DIAGNOSIS — R300 Dysuria: Secondary | ICD-10-CM | POA: Insufficient documentation

## 2012-08-07 DIAGNOSIS — R109 Unspecified abdominal pain: Secondary | ICD-10-CM | POA: Insufficient documentation

## 2012-08-07 DIAGNOSIS — M549 Dorsalgia, unspecified: Secondary | ICD-10-CM | POA: Insufficient documentation

## 2012-08-07 DIAGNOSIS — IMO0002 Reserved for concepts with insufficient information to code with codable children: Secondary | ICD-10-CM | POA: Insufficient documentation

## 2012-08-07 DIAGNOSIS — Z3202 Encounter for pregnancy test, result negative: Secondary | ICD-10-CM | POA: Insufficient documentation

## 2012-08-07 DIAGNOSIS — Y939 Activity, unspecified: Secondary | ICD-10-CM | POA: Insufficient documentation

## 2012-08-07 DIAGNOSIS — F17200 Nicotine dependence, unspecified, uncomplicated: Secondary | ICD-10-CM | POA: Insufficient documentation

## 2012-08-07 HISTORY — DX: Calculus of kidney: N20.0

## 2012-08-07 LAB — CBC WITH DIFFERENTIAL/PLATELET
Hemoglobin: 14.3 g/dL (ref 12.0–15.0)
Lymphs Abs: 3.3 10*3/uL (ref 0.7–4.0)
Monocytes Relative: 8 % (ref 3–12)
Neutro Abs: 5.6 10*3/uL (ref 1.7–7.7)
Neutrophils Relative %: 57 % (ref 43–77)
RBC: 4.78 MIL/uL (ref 3.87–5.11)

## 2012-08-07 LAB — LIPASE, BLOOD: Lipase: 54 U/L (ref 11–59)

## 2012-08-07 LAB — COMPREHENSIVE METABOLIC PANEL
Alkaline Phosphatase: 41 U/L (ref 39–117)
BUN: 15 mg/dL (ref 6–23)
Chloride: 104 mEq/L (ref 96–112)
GFR calc Af Amer: >90 mL/min (ref >90)
Glucose, Bld: 91 mg/dL (ref 70–99)
Potassium: 3.7 mEq/L (ref 3.5–5.1)
Total Bilirubin: 0.1 mg/dL — ABNORMAL LOW (ref 0.3–1.2)

## 2012-08-07 MED ORDER — ONDANSETRON HCL 4 MG/2ML IJ SOLN
4.0000 mg | Freq: Once | INTRAMUSCULAR | Status: AC
  Administered 2012-08-07: 4 mg via INTRAVENOUS
  Filled 2012-08-07: qty 2

## 2012-08-07 MED ORDER — SODIUM CHLORIDE 0.9 % IV BOLUS (SEPSIS)
500.0000 mL | Freq: Once | INTRAVENOUS | Status: AC
  Administered 2012-08-07: 23:00:00 via INTRAVENOUS

## 2012-08-07 MED ORDER — MORPHINE SULFATE 4 MG/ML IJ SOLN
4.0000 mg | Freq: Once | INTRAMUSCULAR | Status: AC
  Administered 2012-08-07: 4 mg via INTRAVENOUS
  Filled 2012-08-07: qty 1

## 2012-08-07 NOTE — ED Notes (Signed)
Pt back from CT

## 2012-08-07 NOTE — ED Notes (Signed)
PA requested fluids to be run at 50 ml/hr.

## 2012-08-07 NOTE — ED Notes (Signed)
Pt ambulated to CT

## 2012-08-07 NOTE — ED Notes (Signed)
Patient is alert and oriented x3.  She is complaining of right side flank pain that started earlier today. Currently she rates her pain 8 of 10.  She denies nausea.  She states that she has a history of kidney  Stones and is having difficulty urinating.

## 2012-08-07 NOTE — ED Provider Notes (Signed)
History  This chart was scribed for non-physician practitioner working with Kathleen Chick, MD, by Kathleen House, ED Scribe. This patient was seen in room WTR6/WTR6 and the patient's care was started at 11:01 PM   CSN: [id number]  Arrival date & time 08/07/12  2125   First MD Initiated Contact with Patient 08/07/12 2233      Chief Complaint  Patient presents with  . Flank Pain    right side     The history is provided by the patient. No language interpreter was used.   Kathleen House is a 37 y.o. female who presents to the Emergency Department complaining of right sided flank pain that started earlier today and has gradually worsened.  She began experiencing dysuria shortly after the flank pain started.  She has also experienced decreased urination, mild abdominal pain, and mild nausea.  Pt has h/o kidney infections.  She denies vomiting.  Nothing seems to make the sx better or worse.   Patient also states she had a difficult day at work yesterday. 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  Past Medical History  Diagnosis Date  . MRSA (methicillin resistant Staphylococcus aureus)   . Kidney stone     Past Surgical History  Procedure Laterality Date  . Parathyroidectomy    . Tubal ligation    . Hand surgery      History reviewed. No pertinent family history.  History  Substance Use Topics  . Smoking status: Current Every Day Smoker    Types: Cigarettes  . Smokeless tobacco: Never Used  . Alcohol Use: No    OB History   Grav Para Term Preterm Abortions TAB SAB Ect Mult Living                  Review of Systems  Gastrointestinal: Positive for nausea. Negative for vomiting.  Genitourinary: Positive for flank pain (right sided flank pain).  All other systems reviewed and are negative.    Allergies  Review of patient's allergies indicates no known allergies.  Home Medications   Current Outpatient Rx  Name  Route  Sig  Dispense   Refill  . ibuprofen (ADVIL,MOTRIN) 200 MG tablet   Oral   Take 600 mg by mouth every 6 (six) hours as needed for pain. For pain         . HYDROcodone-acetaminophen (NORCO/VICODIN) 5-325 MG per tablet   Oral   Take 2 tablets by mouth every 4 (four) hours as needed for pain.   10 tablet   0   . methocarbamol (ROBAXIN) 500 MG tablet   Oral   Take 1 tablet (500 mg total) by mouth 2 (two) times daily as needed.   20 tablet   0   . sulfamethoxazole-trimethoprim (SEPTRA DS) 800-160 MG per tablet   Oral   Take 1 tablet by mouth every 12 (twelve) hours.   20 tablet   0     BP 112/73  Pulse 90  Temp(Src) 97.5 F (36.4 C) (Oral)  SpO2 100%  LMP 07/09/2012  Physical Exam  Nursing note and vitals reviewed. Constitutional: She is oriented to person, place, and time. She appears well-developed and well-nourished. No distress.  HENT:  Head: Normocephalic and atraumatic.  Mouth/Throat: Oropharynx is clear and moist. No oropharyngeal exudate.  Eyes: Conjunctivae and EOM are normal. Pupils are equal, round, and reactive to light. No scleral icterus.  Neck: Normal range of motion. Neck supple.  Cardiovascular: Normal  rate, regular rhythm, normal heart sounds and intact distal pulses.  Exam reveals no gallop and no friction rub.   No murmur heard. Pulmonary/Chest: Effort normal and breath sounds normal. No respiratory distress. She has no wheezes. She has no rales. She exhibits no tenderness.  Abdominal: Soft. Normal appearance and bowel sounds are normal. She exhibits no distension and no mass. There is no hepatosplenomegaly. There is no tenderness. There is CVA tenderness (right). There is no rigidity, no rebound, no guarding, no tenderness at McBurney's point and negative Murphy's sign.  Musculoskeletal: Normal range of motion. She exhibits no edema.  Right sided paraspinal tenderness.    Lymphadenopathy:    She has no cervical adenopathy.  Neurological: She is alert and oriented to  person, place, and time. She exhibits normal muscle tone. Coordination normal.  Speech is clear and goal oriented Moves extremities without ataxia  Skin: Skin is warm and dry. No rash noted. She is not diaphoretic. No erythema.  Psychiatric: She has a normal mood and affect.    ED Course  Procedures  DIAGNOSTIC STUDIES: Oxygen Saturation is 100% on room air, normal by my interpretation.    COORDINATION OF CARE:  11:06 PM Discussed course of care with pt which includes IV fluids, pain medication, and scan for kidney stones.  Pt understands and agrees.      Labs Reviewed  COMPREHENSIVE METABOLIC PANEL - Abnormal; Notable for the following:    Total Bilirubin 0.1 (*)    GFR calc non Af Amer 85 (*)    All other components within normal limits  URINALYSIS, ROUTINE W REFLEX MICROSCOPIC  CBC WITH DIFFERENTIAL  LIPASE, BLOOD  POCT PREGNANCY, URINE   Ct Abdomen Pelvis Wo Contrast  08/07/2012  *RADIOLOGY REPORT*  Clinical Data: Right flank pain  CT ABDOMEN AND PELVIS WITHOUT CONTRAST  Technique:  Multidetector CT imaging of the abdomen and pelvis was performed following the standard protocol without intravenous contrast.  Comparison: 08/30/2011 chest CT  Findings: Limited images through the lung bases demonstrate no significant appreciable abnormality. The heart size is within normal limits. No pleural or pericardial effusion.  Calcified granuloma right lower lobe.  Organ abnormality/lesion detection is limited in the absence of intravenous contrast. Within this limitation,  unremarkable liver, biliary system, spleen, pancreas, adrenal glands.  Symmetric renal size.  No hydronephrosis or hydroureter.  No urinary tract calculi identified.  No CT evidence for colitis.  Normal appendix.  Small bowel loops are normal course and caliber.  No free intraperitoneal air.  No lymphadenopathy.  Normal caliber aorta and branch vessels.  Small fat containing umbilical hernia.  Thin-walled bladder.  Mildly  prominent uterus.  No adnexal mass. Trace free fluid within the pelvis.  No acute osseous finding.  IMPRESSION: No hydronephrosis or urinary tract calculi.  No acute abdominopelvic process identified by unenhanced CT.   Original Report Authenticated By: Jearld Lesch, M.D.      1. Right flank pain   2. Dysuria   3. Back pain   4. Back strain, initial encounter       MDM  [name] presents with flank pain and dysuria.  Pt with Hx of kidney stone and pyelo.  Suspicious of a similar process tonight.  Will give pain medication, obtain labwork and noncontrast CT.  Pt with pain controlled in the department.  Ct without evidence of kidney stone and no noted stranding around the kidneys.  Will give fluid bolus.  Urinalysis without evidence of urinary tract infection, CMP  unremarkable, lipase within normal limits and CBC unremarkable.  Patient states she is feeling much better. Pain at this point is likely secondary to musculoskeletal strain.  This is likely 2/2 to pts moving large objects. We'll discharge home with antibiotics for UTI if symptoms persist and pain medication and muscle relaxer. I requested the patient not filled his antibiotic, as she continues to have symptoms of dysuria.  1. Medications: bactrim, usual home medications 2. Treatment: rest, drink plenty of fluids, only fill antibiotic if your symptoms persist for greater than 3 days 3. Follow Up: Please followup with your primary doctor for discussion of your diagnoses and further evaluation after today's visit; if you do not have a primary care doctor use the resource guide provided to find one;  I personally performed the services described in this documentation, which was scribed in my presence. The recorded information has been reviewed and is accurate.        Kathleen Client Parlee Amescua, PA-C 08/08/12 [phone]

## 2012-08-08 LAB — URINALYSIS, ROUTINE W REFLEX MICROSCOPIC
Bilirubin Urine: NEGATIVE
Leukocytes, UA: NEGATIVE
Nitrite: NEGATIVE
Specific Gravity, Urine: 1.029 (ref 1.005–1.030)
pH: 5.5 (ref 5.0–8.0)

## 2012-08-08 MED ORDER — SULFAMETHOXAZOLE-TRIMETHOPRIM 800-160 MG PO TABS: 1.0000 | ORAL_TABLET | Freq: Two times a day (BID) | ORAL | Status: DC

## 2012-08-08 MED ORDER — METHOCARBAMOL 500 MG PO TABS: 500.0000 mg | ORAL_TABLET | Freq: Two times a day (BID) | ORAL | Status: DC | PRN

## 2012-08-08 MED ORDER — HYDROCODONE-ACETAMINOPHEN 5-325 MG PO TABS: 2.0000 | ORAL_TABLET | ORAL | Status: DC | PRN

## 2012-08-08 MED ORDER — DIAZEPAM 5 MG PO TABS
10.0000 mg | ORAL_TABLET | Freq: Once | ORAL | Status: AC
  Administered 2012-08-08: 10 mg via ORAL
  Filled 2012-08-08: qty 2

## 2012-08-08 NOTE — ED Provider Notes (Signed)
Medical screening examination/treatment/procedure(s) were performed by non-physician practitioner and as supervising physician I was immediately available for consultation/collaboration.  Ethelda Chick, MD 08/08/12 1501

## 2012-08-15 ENCOUNTER — Encounter (HOSPITAL_COMMUNITY): Payer: Self-pay | Admitting: Emergency Medicine

## 2012-08-15 ENCOUNTER — Emergency Department (HOSPITAL_COMMUNITY): Payer: Self-pay

## 2012-08-15 ENCOUNTER — Emergency Department (HOSPITAL_COMMUNITY)
Admission: EM | Admit: 2012-08-15 | Discharge: 2012-08-15 | Disposition: A | Discharge status: Home / Self Care | Payer: Self-pay | Attending: Emergency Medicine | Admitting: Emergency Medicine

## 2012-08-15 DIAGNOSIS — S6990XA Unspecified injury of unspecified wrist, hand and finger(s), initial encounter: Secondary | ICD-10-CM | POA: Insufficient documentation

## 2012-08-15 DIAGNOSIS — Y939 Activity, unspecified: Secondary | ICD-10-CM | POA: Insufficient documentation

## 2012-08-15 DIAGNOSIS — R296 Repeated falls: Secondary | ICD-10-CM | POA: Insufficient documentation

## 2012-08-15 DIAGNOSIS — F17200 Nicotine dependence, unspecified, uncomplicated: Secondary | ICD-10-CM | POA: Insufficient documentation

## 2012-08-15 DIAGNOSIS — IMO0001 Reserved for inherently not codable concepts without codable children: Secondary | ICD-10-CM | POA: Insufficient documentation

## 2012-08-15 DIAGNOSIS — Y929 Unspecified place or not applicable: Secondary | ICD-10-CM | POA: Insufficient documentation

## 2012-08-15 DIAGNOSIS — Z87442 Personal history of urinary calculi: Secondary | ICD-10-CM | POA: Insufficient documentation

## 2012-08-15 DIAGNOSIS — Z8614 Personal history of Methicillin resistant Staphylococcus aureus infection: Secondary | ICD-10-CM | POA: Insufficient documentation

## 2012-08-15 MED ORDER — OXYCODONE-ACETAMINOPHEN 5-325 MG PO TABS: ORAL_TABLET | ORAL | Status: DC

## 2012-08-15 MED ORDER — OXYCODONE-ACETAMINOPHEN 5-325 MG PO TABS
1.0000 | ORAL_TABLET | Freq: Once | ORAL | Status: AC
  Administered 2012-08-15: 1 via ORAL
  Filled 2012-08-15: qty 1
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
## 2012-08-16 NOTE — ED Provider Notes (Signed)
Medical screening examination/treatment/procedure(s) were performed by non-physician practitioner and as supervising physician I was immediately available for consultation/collaboration.  Anthony T Allen, MD 08/16/12 2031 

## 2012-09-24 ENCOUNTER — Emergency Department (HOSPITAL_COMMUNITY)
Admission: EM | Admit: 2012-09-24 | Discharge: 2012-09-24 | Disposition: A | Discharge status: Home / Self Care | Payer: Self-pay | Attending: Emergency Medicine | Admitting: Emergency Medicine

## 2012-09-24 ENCOUNTER — Encounter (HOSPITAL_COMMUNITY): Payer: Self-pay | Admitting: Emergency Medicine

## 2012-09-24 ENCOUNTER — Telehealth (HOSPITAL_COMMUNITY): Payer: Self-pay | Admitting: *Deleted

## 2012-09-24 DIAGNOSIS — F17200 Nicotine dependence, unspecified, uncomplicated: Secondary | ICD-10-CM | POA: Insufficient documentation

## 2012-09-24 DIAGNOSIS — Y92410 Unspecified street and highway as the place of occurrence of the external cause: Secondary | ICD-10-CM | POA: Insufficient documentation

## 2012-09-24 DIAGNOSIS — Z3202 Encounter for pregnancy test, result negative: Secondary | ICD-10-CM | POA: Insufficient documentation

## 2012-09-24 DIAGNOSIS — Z8614 Personal history of Methicillin resistant Staphylococcus aureus infection: Secondary | ICD-10-CM | POA: Insufficient documentation

## 2012-09-24 DIAGNOSIS — T148XXA Other injury of unspecified body region, initial encounter: Secondary | ICD-10-CM

## 2012-09-24 DIAGNOSIS — S239XXA Sprain of unspecified parts of thorax, initial encounter: Secondary | ICD-10-CM | POA: Insufficient documentation

## 2012-09-24 DIAGNOSIS — Z87442 Personal history of urinary calculi: Secondary | ICD-10-CM | POA: Insufficient documentation

## 2012-09-24 DIAGNOSIS — Y9389 Activity, other specified: Secondary | ICD-10-CM | POA: Insufficient documentation

## 2012-09-24 LAB — URINALYSIS, ROUTINE W REFLEX MICROSCOPIC
Glucose, UA: NEGATIVE mg/dL
Hgb urine dipstick: NEGATIVE
Protein, ur: NEGATIVE mg/dL
Specific Gravity, Urine: 1.013 (ref 1.005–1.030)
pH: 7.5 (ref 5.0–8.0)

## 2012-09-24 LAB — POCT PREGNANCY, URINE: Preg Test, Ur: NEGATIVE

## 2012-09-24 LAB — URINE MICROSCOPIC-ADD ON

## 2012-09-24 MED ORDER — HYDROCODONE-ACETAMINOPHEN 5-325 MG PO TABS
2.0000 | ORAL_TABLET | Freq: Once | ORAL | Status: AC
  Administered 2012-09-24: 2 via ORAL
  Filled 2012-09-24: qty 2

## 2012-09-24 MED ORDER — HYDROCODONE-ACETAMINOPHEN 5-325 MG PO TABS: 1.0000 | ORAL_TABLET | ORAL | Status: DC | PRN

## 2012-09-24 MED ORDER — METHOCARBAMOL 100 MG/ML IJ SOLN: 1000.0000 mg | Freq: Once | INTRAMUSCULAR | Status: DC

## 2012-09-24 MED ORDER — ONDANSETRON HCL 8 MG PO TABS: 4.0000 mg | ORAL_TABLET | Freq: Once | ORAL | Status: DC

## 2012-09-24 MED ORDER — METHOCARBAMOL 500 MG PO TABS
1000.0000 mg | ORAL_TABLET | Freq: Once | ORAL | Status: AC
  Administered 2012-09-24: 1000 mg via ORAL
  Filled 2012-09-24: qty 2

## 2012-09-24 MED ORDER — ONDANSETRON 4 MG PO TBDP
4.0000 mg | ORAL_TABLET | Freq: Once | ORAL | Status: AC
  Administered 2012-09-24: 4 mg via ORAL

## 2012-09-24 MED ORDER — PREDNISONE 20 MG PO TABS
60.0000 mg | ORAL_TABLET | Freq: Once | ORAL | Status: AC
  Administered 2012-09-24: 60 mg via ORAL
  Filled 2012-09-24: qty 3

## 2012-09-24 MED ORDER — DICLOFENAC SODIUM 75 MG PO TBEC: 75.0000 mg | DELAYED_RELEASE_TABLET | Freq: Two times a day (BID) | ORAL | Status: DC

## 2012-09-24 MED ORDER — CYCLOBENZAPRINE HCL 10 MG PO TABS: 10.0000 mg | ORAL_TABLET | Freq: Three times a day (TID) | ORAL | Status: DC | PRN
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
## 2012-09-25 NOTE — ED Provider Notes (Signed)
Medical screening examination/treatment/procedure(s) were performed by non-physician practitioner and as supervising physician I was immediately available for consultation/collaboration.   Gwyneth Sprout, MD 09/25/12 [phone]

## 2012-10-16 ENCOUNTER — Encounter (HOSPITAL_COMMUNITY): Payer: Self-pay | Admitting: Emergency Medicine

## 2012-10-16 ENCOUNTER — Emergency Department (HOSPITAL_COMMUNITY)
Admission: EM | Admit: 2012-10-16 | Discharge: 2012-10-16 | Disposition: A | Discharge status: Home / Self Care | Payer: Self-pay | Attending: Emergency Medicine | Admitting: Emergency Medicine

## 2012-10-16 DIAGNOSIS — S46819A Strain of other muscles, fascia and tendons at shoulder and upper arm level, unspecified arm, initial encounter: Secondary | ICD-10-CM | POA: Insufficient documentation

## 2012-10-16 DIAGNOSIS — Z87442 Personal history of urinary calculi: Secondary | ICD-10-CM | POA: Insufficient documentation

## 2012-10-16 DIAGNOSIS — Y9389 Activity, other specified: Secondary | ICD-10-CM | POA: Insufficient documentation

## 2012-10-16 DIAGNOSIS — Y929 Unspecified place or not applicable: Secondary | ICD-10-CM | POA: Insufficient documentation

## 2012-10-16 DIAGNOSIS — X503XXA Overexertion from repetitive movements, initial encounter: Secondary | ICD-10-CM | POA: Insufficient documentation

## 2012-10-16 DIAGNOSIS — S43499A Other sprain of unspecified shoulder joint, initial encounter: Secondary | ICD-10-CM | POA: Insufficient documentation

## 2012-10-16 DIAGNOSIS — F17200 Nicotine dependence, unspecified, uncomplicated: Secondary | ICD-10-CM | POA: Insufficient documentation

## 2012-10-16 DIAGNOSIS — S46811A Strain of other muscles, fascia and tendons at shoulder and upper arm level, right arm, initial encounter: Secondary | ICD-10-CM

## 2012-10-16 DIAGNOSIS — Z8614 Personal history of Methicillin resistant Staphylococcus aureus infection: Secondary | ICD-10-CM | POA: Insufficient documentation

## 2012-10-16 MED ORDER — HYDROCODONE-ACETAMINOPHEN 5-325 MG PO TABS: 1.0000 | ORAL_TABLET | Freq: Four times a day (QID) | ORAL | Status: DC | PRN

## 2012-10-16 MED ORDER — NAPROXEN 500 MG PO TABS: 500.0000 mg | ORAL_TABLET | Freq: Two times a day (BID) | ORAL | Status: DC

## 2012-10-16 MED ORDER — METHOCARBAMOL 500 MG PO TABS: 500.0000 mg | ORAL_TABLET | Freq: Two times a day (BID) | ORAL | Status: DC

## 2012-10-16 NOTE — ED Provider Notes (Signed)
History    This chart was scribed for Kathleen House, a non-physician practitioner working with Loren Racer, MD by Lewanda Rife, ED Scribe. This patient was seen in room WTR1/WLPT1 and the patient's care was started at 1546.     CSN: [id number]  Arrival date & time 10/16/12  1520   First MD Initiated Contact with Patient 10/16/12 1533      Chief Complaint  Patient presents with  . Back Pain    (Consider location/radiation/quality/duration/timing/severity/associated sxs/prior treatment) HPI HPI Comments: Kathleen House is a 37 y.o. female who presents to the Emergency Department complaining of constant worsening upper back pain onset 2 days since moving boxes with a dolley. Reports symptoms are worsened with movement. Denies neck pain, paresthesias, weakness, and headaches. Reports hx of chronic back pain. Reports taking ibuprofen with no relief of symptoms.  Past Medical History  Diagnosis Date  . MRSA (methicillin resistant Staphylococcus aureus)   . Kidney stone     Past Surgical History  Procedure Laterality Date  . Parathyroidectomy    . Tubal ligation    . Hand surgery      No family history on file.  History  Substance Use Topics  . Smoking status: Current Every Day Smoker    Types: Cigarettes  . Smokeless tobacco: Never Used  . Alcohol Use: No    OB History   Grav Para Term Preterm Abortions TAB SAB Ect Mult Living                  Review of Systems A complete 10 system review of systems was obtained and all systems are negative except as noted in the HPI and PMH.   Allergies  Review of patient's allergies indicates no known allergies.  Home Medications   Current Outpatient Rx  Name  Route  Sig  Dispense  Refill  . cyclobenzaprine (FLEXERIL) 10 MG tablet   Oral   Take 1 tablet (10 mg total) by mouth 3 (three) times daily as needed for muscle spasms.   21 tablet   0   . diclofenac (VOLTAREN) 75 MG EC tablet   Oral   Take 1  tablet (75 mg total) by mouth 2 (two) times daily.   12 tablet   0   . HYDROcodone-acetaminophen (NORCO/VICODIN) 5-325 MG per tablet   Oral   Take 1 tablet by mouth every 4 (four) hours as needed for pain.   20 tablet   0   . Ibuprofen (IBU PO)   Oral   Take 4-5 tablets by mouth 4 (four) times daily as needed (pain).           LMP 09/17/2012  Physical Exam  Nursing note and vitals reviewed. Constitutional: She is oriented to person, place, and time. She appears well-developed and well-nourished. No distress.  HENT:  Head: Normocephalic and atraumatic.  Eyes: EOM are normal.  Neck: Normal range of motion. Neck supple. No tracheal deviation present.  Cardiovascular: Normal rate, regular rhythm and normal heart sounds.   Pulmonary/Chest: Effort normal and breath sounds normal. No respiratory distress.  Musculoskeletal: Normal range of motion.       Cervical back: She exhibits tenderness (over right trapezius muscle). She exhibits no bony tenderness.       Thoracic back: She exhibits no bony tenderness.       Lumbar back: She exhibits no bony tenderness.  Neurological: She is alert and oriented to person, place, and time. She has normal strength. No sensory  deficit.  Strength is 5/5 bilaterally   Skin: Skin is warm and dry.  Psychiatric: She has a normal mood and affect. Her behavior is normal.    ED Course  Procedures (including critical care time) Medications - No data to display  Labs Reviewed - No data to display No results found.   1. Trapezius strain, right, initial encounter       MDM  Patient presents with pain over the right trapezius.  Pain worsened with movement.  Pain consistent with a muscle strain.  No red flags of back pain.  Patient discharged home with muscle relaxer and short course of pain medication.  Return precautions given.  I personally performed the services described in this documentation, which was scribed in my presence. The recorded  information has been reviewed and is accurate.   Pascal Lux Roslyn, PA-C 10/16/12 [phone]

## 2012-10-16 NOTE — ED Notes (Signed)
Patient reports that she pushed a dolley full of materials 2 days ago and she has had back pain since.

## 2012-10-16 NOTE — ED Provider Notes (Signed)
Medical screening examination/treatment/procedure(s) were performed by non-physician practitioner and as supervising physician I was immediately available for consultation/collaboration.   Latesia Norrington, MD 10/16/12 [phone] 

## 2012-10-24 ENCOUNTER — Emergency Department (HOSPITAL_COMMUNITY)
Admission: EM | Admit: 2012-10-24 | Discharge: 2012-10-25 | Disposition: A | Discharge status: Home / Self Care | Payer: Self-pay | Attending: Emergency Medicine | Admitting: Emergency Medicine

## 2012-10-24 DIAGNOSIS — Z3202 Encounter for pregnancy test, result negative: Secondary | ICD-10-CM | POA: Insufficient documentation

## 2012-10-24 DIAGNOSIS — Z862 Personal history of diseases of the blood and blood-forming organs and certain disorders involving the immune mechanism: Secondary | ICD-10-CM | POA: Insufficient documentation

## 2012-10-24 DIAGNOSIS — Z8639 Personal history of other endocrine, nutritional and metabolic disease: Secondary | ICD-10-CM | POA: Insufficient documentation

## 2012-10-24 DIAGNOSIS — M549 Dorsalgia, unspecified: Secondary | ICD-10-CM | POA: Insufficient documentation

## 2012-10-24 DIAGNOSIS — R109 Unspecified abdominal pain: Secondary | ICD-10-CM | POA: Insufficient documentation

## 2012-10-24 DIAGNOSIS — R197 Diarrhea, unspecified: Secondary | ICD-10-CM | POA: Insufficient documentation

## 2012-10-24 DIAGNOSIS — Z87442 Personal history of urinary calculi: Secondary | ICD-10-CM | POA: Insufficient documentation

## 2012-10-24 DIAGNOSIS — F17200 Nicotine dependence, unspecified, uncomplicated: Secondary | ICD-10-CM | POA: Insufficient documentation

## 2012-10-24 DIAGNOSIS — R112 Nausea with vomiting, unspecified: Secondary | ICD-10-CM | POA: Insufficient documentation

## 2012-10-24 DIAGNOSIS — Z8614 Personal history of Methicillin resistant Staphylococcus aureus infection: Secondary | ICD-10-CM | POA: Insufficient documentation

## 2012-10-24 DIAGNOSIS — Z9851 Tubal ligation status: Secondary | ICD-10-CM | POA: Insufficient documentation

## 2012-10-24 HISTORY — DX: Dorsalgia, unspecified: M54.9

## 2012-10-24 HISTORY — DX: Disorder of thyroid, unspecified: E07.9

## 2012-10-24 NOTE — ED Notes (Signed)
Pt presents with NAD- c/o of rt upped ride pain and RUQ pain greater after eating dinner- c/o of nausea and burning. C/o of heartburn all week. Pain radiates to the rt upper back.  LBM today diarrhea all week.

## 2012-10-25 ENCOUNTER — Emergency Department (HOSPITAL_COMMUNITY): Payer: Self-pay

## 2012-10-25 ENCOUNTER — Encounter (HOSPITAL_COMMUNITY): Payer: Self-pay

## 2012-10-25 LAB — CBC WITH DIFFERENTIAL/PLATELET
Basophils Absolute: 0 10*3/uL (ref 0.0–0.1)
Basophils Relative: 0 % (ref 0–1)
Eosinophils Relative: 3 % (ref 0–5)
HCT: 42.8 % (ref 36.0–46.0)
Lymphocytes Relative: 35 % (ref 12–46)
MCHC: 33.6 g/dL (ref 30.0–36.0)
Monocytes Absolute: 1 10*3/uL (ref 0.1–1.0)
Neutro Abs: 4.8 10*3/uL (ref 1.7–7.7)
Platelets: 251 10*3/uL (ref 150–400)
RDW: 13.2 % (ref 11.5–15.5)
WBC: 9.4 10*3/uL (ref 4.0–10.5)

## 2012-10-25 LAB — COMPREHENSIVE METABOLIC PANEL
ALT: 9 U/L (ref 0–35)
AST: 13 U/L (ref 0–37)
Albumin: 3.7 g/dL (ref 3.5–5.2)
Calcium: 9.7 mg/dL (ref 8.4–10.5)
Creatinine, Ser: 0.99 mg/dL (ref 0.50–1.10)
Sodium: 139 mEq/L (ref 135–145)

## 2012-10-25 LAB — URINALYSIS, ROUTINE W REFLEX MICROSCOPIC
Glucose, UA: NEGATIVE mg/dL
Leukocytes, UA: NEGATIVE
Nitrite: NEGATIVE
Specific Gravity, Urine: 1.023 (ref 1.005–1.030)
pH: 5.5 (ref 5.0–8.0)

## 2012-10-25 LAB — PREGNANCY, URINE: Preg Test, Ur: NEGATIVE

## 2012-10-25 LAB — AMYLASE: Amylase: 54 U/L (ref 0–105)

## 2012-10-25 MED ORDER — SODIUM CHLORIDE 0.9 % IV SOLN
Freq: Once | INTRAVENOUS | Status: AC
  Administered 2012-10-25: 01:00:00 via INTRAVENOUS

## 2012-10-25 MED ORDER — ONDANSETRON HCL 4 MG PO TABS: 4.0000 mg | ORAL_TABLET | Freq: Four times a day (QID) | ORAL | Status: DC

## 2012-10-25 MED ORDER — MORPHINE SULFATE 4 MG/ML IJ SOLN
6.0000 mg | Freq: Once | INTRAMUSCULAR | Status: AC
  Administered 2012-10-25: 6 mg via INTRAVENOUS
  Filled 2012-10-25: qty 2

## 2012-10-25 MED ORDER — IOHEXOL 300 MG/ML  SOLN
50.0000 mL | Freq: Once | INTRAMUSCULAR | Status: AC | PRN
  Administered 2012-10-25: 50 mL via ORAL

## 2012-10-25 MED ORDER — MORPHINE SULFATE 4 MG/ML IJ SOLN
6.0000 mg | Freq: Once | INTRAMUSCULAR | Status: AC
  Administered 2012-10-25: 4 mg via INTRAVENOUS
  Filled 2012-10-25: qty 1

## 2012-10-25 MED ORDER — ONDANSETRON HCL 4 MG/2ML IJ SOLN
4.0000 mg | Freq: Once | INTRAMUSCULAR | Status: AC
  Administered 2012-10-25: 4 mg via INTRAVENOUS
  Filled 2012-10-25: qty 2

## 2012-10-25 MED ORDER — OXYCODONE-ACETAMINOPHEN 5-325 MG PO TABS: 1.0000 | ORAL_TABLET | Freq: Four times a day (QID) | ORAL | Status: DC | PRN

## 2012-10-25 MED ORDER — IOHEXOL 300 MG/ML  SOLN
100.0000 mL | Freq: Once | INTRAMUSCULAR | Status: AC | PRN
  Administered 2012-10-25: 100 mL via INTRAVENOUS

## 2012-10-25 NOTE — ED Provider Notes (Signed)
Medical screening examination/treatment/procedure(s) were performed by non-physician practitioner and as supervising physician I was immediately available for consultation/collaboration.  John-Adam Gavina Dildine, M.D.   John-Adam Thurza Kwiecinski, MD 10/25/12 0658 

## 2012-10-25 NOTE — ED Notes (Signed)
[id number]> Expected date:<BR> Expected time:<BR> Means of arrival:<BR> Comments:<BR> Hold for Bed 3

## 2012-10-25 NOTE — ED Provider Notes (Signed)
History     CSN: [id number]  Arrival date & time 10/24/12  2204   First MD Initiated Contact with Patient 10/25/12 0021      Chief Complaint  Patient presents with  . Back Pain  . Nausea  . Abdominal Pain    (Consider location/radiation/quality/duration/timing/severity/associated sxs/prior treatment) HPI Comments: Patient with RUQ pain, nausea worse after eating   Patient is a 37 y.o. female presenting with back pain and abdominal pain. The history is provided by the patient.  Back Pain Quality:  Stabbing Pain severity:  Moderate Timing:  Intermittent Chronicity:  New Associated symptoms: abdominal pain   Associated symptoms: no dysuria, no fever and no weakness   Abdominal Pain Associated symptoms: diarrhea, nausea and vomiting   Associated symptoms: no chills, no dysuria, no fever and no shortness of breath     Past Medical History  Diagnosis Date  . MRSA (methicillin resistant Staphylococcus aureus)   . Kidney stone   . Back pain   . Thyroid disease     Past Surgical History  Procedure Laterality Date  . Parathyroidectomy    . Tubal ligation    . Hand surgery      No family history on file.  History  Substance Use Topics  . Smoking status: Current Every Day Smoker    Types: Cigarettes  . Smokeless tobacco: Never Used  . Alcohol Use: No    OB History   Grav Para Term Preterm Abortions TAB SAB Ect Mult Living                  Review of Systems  Constitutional: Negative for fever, chills and appetite change.  Respiratory: Negative for shortness of breath.   Gastrointestinal: Positive for nausea, vomiting, abdominal pain and diarrhea.  Genitourinary: Negative for dysuria.  Musculoskeletal: Positive for back pain.  Skin: Negative for rash and wound.  Neurological: Negative for dizziness and weakness.  All other systems reviewed and are negative.    Allergies  Review of patient's allergies indicates no known allergies.  Home Medications    Current Outpatient Rx  Name  Route  Sig  Dispense  Refill  . methocarbamol (ROBAXIN) 500 MG tablet   Oral   Take 1 tablet (500 mg total) by mouth 2 (two) times daily.   20 tablet   0   . naproxen (NAPROSYN) 500 MG tablet   Oral   Take 1 tablet (500 mg total) by mouth 2 (two) times daily.   30 tablet   0   . ondansetron (ZOFRAN) 4 MG tablet   Oral   Take 1 tablet (4 mg total) by mouth every 6 (six) hours.   12 tablet   0   . oxyCODONE-acetaminophen (PERCOCET/ROXICET) 5-325 MG per tablet   Oral   Take 1-2 tablets by mouth every 6 (six) hours as needed for pain.   20 tablet   0     BP 104/53  Pulse 62  Temp(Src) 97.9 F (36.6 C) (Oral)  Resp 18  Ht 5\' 9"  (1.753 m)  Wt 195 lb (88.451 kg)  BMI 28.78 kg/m2  SpO2 94%  LMP 10/16/2012  Physical Exam  Nursing note and vitals reviewed. Constitutional: She is oriented to person, place, and time. She appears well-developed and well-nourished.  HENT:  Head: Normocephalic.  Eyes: Pupils are equal, round, and reactive to light.  Neck: Normal range of motion.  Cardiovascular: Normal rate.   Pulmonary/Chest: Effort normal.  Abdominal: Soft. Bowel sounds are normal. She  exhibits no distension. There is tenderness in the right upper quadrant and epigastric area. There is no rebound and no guarding.  Musculoskeletal: Normal range of motion.  Neurological: She is alert and oriented to person, place, and time.  Skin: Skin is warm and dry. No rash noted.    ED Course  Procedures (including critical care time)  Labs Reviewed  COMPREHENSIVE METABOLIC PANEL - Abnormal; Notable for the following:    Total Bilirubin 0.1 (*)    GFR calc non Af Amer 73 (*)    GFR calc Af Amer 85 (*)    All other components within normal limits  URINALYSIS, ROUTINE W REFLEX MICROSCOPIC - Abnormal; Notable for the following:    APPearance CLOUDY (*)    All other components within normal limits  CBC WITH DIFFERENTIAL  LIPASE, BLOOD  AMYLASE   PREGNANCY, URINE   US Abdomen Complete  10/25/2012   *RADIOLOGY REPORT*  Clinical Data:  Right upper quadrant pain.  Vomiting.  ABDOMINAL ULTRASOUND COMPLETE  Comparison:  None.  Findings:  Gallbladder:  Incompletely distended as patient has not been n.p.o.  No gallstones identified.  No abnormal gallbladder wall thickening demonstrated.  Common Bile Duct:  Within normal limits in caliber. Measures 2-3 mm in diameter.  Liver: No focal mass lesion identified.  Within normal limits in parenchymal echogenicity.  IVC:  Appears normal.  Pancreas:  No abnormality identified.  Spleen:  Within normal limits in size and echotexture.  Right kidney:  Normal in size and parenchymal echogenicity.  No evidence of mass or hydronephrosis.  Left kidney:  Normal in size and parenchymal echogenicity.  No evidence of mass or hydronephrosis.  Abdominal Aorta:  No aneurysm identified.  IMPRESSION: Negative abdominal ultrasound.   Original Report Authenticated By: Myles Rosenthal, M.D.   Ct Abdomen Pelvis W Contrast  10/25/2012   *RADIOLOGY REPORT*  Clinical Data: Right upper quadrant abdominal pain and nausea.  CT ABDOMEN AND PELVIS WITH CONTRAST  Technique:  Multidetector CT imaging of the abdomen and pelvis was performed following the standard protocol during bolus administration of intravenous contrast.  Contrast: OMNIPAQUE IOHEXOL 300 MG/ML  SOLN  Comparison: 08/07/2012  Findings: The lung bases demonstrate bibasilar subsegmental dependent atelectasis.  The liver is unremarkable.  No focal hepatic lesions or intrahepatic biliary dilatation.  The gallbladder is normal.  No common bile duct dilatation.  The pancreas is normal.  The spleen is normal.  The adrenal glands and kidneys are normal.  The stomach, duodenum, small bowel and colon are unremarkable.  No inflammatory changes or mass lesions.  The appendix is normal.  No mesenteric or retroperitoneal mass or adenopathy.  The aorta is normal in caliber.  The major branch  vessels are normal.  The uterus is unremarkable.  Probable secretory phase evaluation. Both torus normal except for a small right ovarian cyst.  The bladder is normal.  No pelvic mass, adenopathy or free pelvic fluid collections.  No inguinal mass or adenopathy.  The bony structures are unremarkable.  IMPRESSION: No acute abdominal/pelvic findings, mass lesions or adenopathy.   Original Report Authenticated By: Rudie Meyer, M.D.     1. Abdominal pain       MDM  Ultrasound, and abdominal pelvis, CT revealed no pathology.  Labs are within normal patient is more comfortable than previously.  We discussed discharge home with antiemetic and pain medication, and 24 hour followup         Arman Filter, NP 10/25/12 0510

## 2012-10-25 NOTE — ED Notes (Signed)
US complete at this time.

## 2012-12-05 ENCOUNTER — Emergency Department (HOSPITAL_COMMUNITY)
Admission: EM | Admit: 2012-12-05 | Discharge: 2012-12-05 | Disposition: A | Discharge status: Home / Self Care | Payer: Self-pay | Attending: Emergency Medicine | Admitting: Emergency Medicine

## 2012-12-05 ENCOUNTER — Encounter (HOSPITAL_COMMUNITY): Payer: Self-pay

## 2012-12-05 DIAGNOSIS — M25531 Pain in right wrist: Secondary | ICD-10-CM

## 2012-12-05 DIAGNOSIS — F17200 Nicotine dependence, unspecified, uncomplicated: Secondary | ICD-10-CM | POA: Insufficient documentation

## 2012-12-05 DIAGNOSIS — Z8639 Personal history of other endocrine, nutritional and metabolic disease: Secondary | ICD-10-CM | POA: Insufficient documentation

## 2012-12-05 DIAGNOSIS — Z8614 Personal history of Methicillin resistant Staphylococcus aureus infection: Secondary | ICD-10-CM | POA: Insufficient documentation

## 2012-12-05 DIAGNOSIS — M25539 Pain in unspecified wrist: Secondary | ICD-10-CM | POA: Insufficient documentation

## 2012-12-05 DIAGNOSIS — IMO0001 Reserved for inherently not codable concepts without codable children: Secondary | ICD-10-CM | POA: Insufficient documentation

## 2012-12-05 DIAGNOSIS — Z79899 Other long term (current) drug therapy: Secondary | ICD-10-CM | POA: Insufficient documentation

## 2012-12-05 DIAGNOSIS — Z862 Personal history of diseases of the blood and blood-forming organs and certain disorders involving the immune mechanism: Secondary | ICD-10-CM | POA: Insufficient documentation

## 2012-12-05 DIAGNOSIS — Z87442 Personal history of urinary calculi: Secondary | ICD-10-CM | POA: Insufficient documentation

## 2012-12-05 MED ORDER — OXYCODONE-ACETAMINOPHEN 5-325 MG PO TABS: 1.0000 | ORAL_TABLET | Freq: Four times a day (QID) | ORAL | Status: DC | PRN

## 2012-12-05 NOTE — ED Provider Notes (Signed)
Medical screening examination/treatment/procedure(s) were performed by non-physician practitioner and as supervising physician I was immediately available for consultation/collaboration. Devoria Albe, MD, Armando Gang   Ward Givens, MD 12/05/12 [phone]

## 2012-12-05 NOTE — ED Notes (Signed)
She states she began to feel "weak and swimmy-headded" at church today.  A friend checked her b/p and found it to be "real low".  Pt. Is alert and oriented x 4.  She tells me she had a cataract removed 3 days ago and "that's doing fine".

## 2012-12-05 NOTE — ED Notes (Signed)
First ED noted filed about being swimmy headed and cataract removal was charted on the incorrect pt

## 2012-12-05 NOTE — ED Provider Notes (Signed)
History    CSN: [id number] Arrival date & time 12/05/12  1311  First MD Initiated Contact with Patient 12/05/12 1335     Chief Complaint  Patient presents with  . Wrist Pain   (Consider location/radiation/quality/duration/timing/severity/associated sxs/prior Treatment) HPI Comments: Patient presents with chief complaint of right wrist and forearm pain that began 2 days ago. She noted swelling last night that is improving. Patient states that she was pulling very hard on a door of a truck that was jammed prior to the pain beginning and thinks this may have injured it. Patient states that she has a tingling sensation in her ring and little fingers. Movements or lifting makes the pain worse. She's been treating with Aleve without relief. She denies elbow pain or shoulder pain. Onset of symptoms gradual. Course is gradually worsening. Nothing makes symptoms better.  The history is provided by the patient.   Past Medical History  Diagnosis Date  . MRSA (methicillin resistant Staphylococcus aureus)   . Kidney stone   . Back pain   . Thyroid disease    Past Surgical History  Procedure Laterality Date  . Parathyroidectomy    . Tubal ligation    . Hand surgery     History reviewed. No pertinent family history. History  Substance Use Topics  . Smoking status: Current Every Day Smoker    Types: Cigarettes  . Smokeless tobacco: Never Used  . Alcohol Use: No   OB History   Grav Para Term Preterm Abortions TAB SAB Ect Mult Living                 Review of Systems  Constitutional: Positive for activity change.  HENT: Negative for neck pain.   Musculoskeletal: Positive for myalgias and arthralgias. Negative for back pain and joint swelling.  Skin: Negative for wound.  Neurological: Negative for weakness and numbness.    Allergies  Review of patient's allergies indicates no known allergies.  Home Medications   Current Outpatient Rx  Name  Route  Sig  Dispense  Refill  .  methocarbamol (ROBAXIN) 500 MG tablet   Oral   Take 1 tablet (500 mg total) by mouth 2 (two) times daily.   20 tablet   0   . naproxen (NAPROSYN) 500 MG tablet   Oral   Take 1 tablet (500 mg total) by mouth 2 (two) times daily.   30 tablet   0   . ondansetron (ZOFRAN) 4 MG tablet   Oral   Take 1 tablet (4 mg total) by mouth every 6 (six) hours.   12 tablet   0   . oxyCODONE-acetaminophen (PERCOCET/ROXICET) 5-325 MG per tablet   Oral   Take 1-2 tablets by mouth every 6 (six) hours as needed for pain.   8 tablet   0    BP 117/73  Pulse 65  Temp(Src) 97.5 F (36.4 C) (Oral)  SpO2 96% Physical Exam  Nursing note and vitals reviewed. Constitutional: She appears well-developed and well-nourished.  HENT:  Head: Normocephalic and atraumatic.  Eyes: Pupils are equal, round, and reactive to light.  Neck: Normal range of motion. Neck supple.  Cardiovascular: Exam reveals no decreased pulses.   Musculoskeletal: She exhibits tenderness. She exhibits no edema.       Right shoulder: Normal.       Right elbow: Normal.      Right wrist: She exhibits tenderness, bony tenderness and swelling (mild). She exhibits normal range of motion.  Cervical back: Normal.       Right upper arm: Normal.       Right forearm: She exhibits tenderness. She exhibits no swelling (compartments soft).       Right hand: She exhibits normal range of motion, normal capillary refill and no deformity. Decreased sensation noted. Decreased sensation is present in the ulnar distribution (Tingling). Decreased sensation is not present in the medial distribution and is not present in the radial distribution. Decreased strength noted. She exhibits no finger abduction, no thumb/finger opposition and no wrist extension trouble.  Neurological: She is alert. No sensory deficit.  Motor, sensation, and vascular distal to the injury is fully intact.   Skin: Skin is warm and dry.  Psychiatric: She has a normal mood and  affect.    ED Course  Procedures (including critical care time) Labs Reviewed - No data to display No results found. 1. Wrist pain, acute, right    1:53 PM Patient seen and examined.    Vital signs reviewed and are as follows: Filed Vitals:   12/05/12 1340  BP: 117/73  Pulse: 65  Temp: 97.5 F (36.4 C)   Splint by orthopedic tech.  Patient was counseled on RICE protocol and told to rest injury, use ice for no longer than 15 minutes every hour, compress the area, and elevate above the level of their heart as much as possible to reduce swelling.  Questions answered.  Patient verbalized understanding.    Patient counseled on use of narcotic pain medications. Counseled not to combine these medications with others containing tylenol. Urged not to drink alcohol, drive, or perform any other activities that requires focus while taking these medications. The patient verbalizes understanding and agrees with the plan.  Orthopedic referral in case pain does not improve with conservative treatment.   MDM  Patient with wrist and forearm pain. I suspect that she has muscle strain from pulling on the jammed door. There is no evidence of neurovascular compromise or compartment syndrome. Patient needs symptomatic treatment and rest. She will continue ibuprofen.  Renne Crigler, PA-C 12/05/12 [phone]

## 2013-01-04 ENCOUNTER — Encounter (HOSPITAL_COMMUNITY): Payer: Self-pay

## 2013-01-04 ENCOUNTER — Emergency Department (HOSPITAL_COMMUNITY)
Admission: EM | Admit: 2013-01-04 | Discharge: 2013-01-04 | Disposition: A | Discharge status: Home / Self Care | Payer: No Typology Code available for payment source | Attending: Emergency Medicine | Admitting: Emergency Medicine

## 2013-01-04 DIAGNOSIS — Z87442 Personal history of urinary calculi: Secondary | ICD-10-CM | POA: Insufficient documentation

## 2013-01-04 DIAGNOSIS — Z8639 Personal history of other endocrine, nutritional and metabolic disease: Secondary | ICD-10-CM | POA: Insufficient documentation

## 2013-01-04 DIAGNOSIS — K089 Disorder of teeth and supporting structures, unspecified: Secondary | ICD-10-CM | POA: Insufficient documentation

## 2013-01-04 DIAGNOSIS — Z8614 Personal history of Methicillin resistant Staphylococcus aureus infection: Secondary | ICD-10-CM | POA: Insufficient documentation

## 2013-01-04 DIAGNOSIS — Z792 Long term (current) use of antibiotics: Secondary | ICD-10-CM | POA: Insufficient documentation

## 2013-01-04 DIAGNOSIS — Z862 Personal history of diseases of the blood and blood-forming organs and certain disorders involving the immune mechanism: Secondary | ICD-10-CM | POA: Insufficient documentation

## 2013-01-04 DIAGNOSIS — Z791 Long term (current) use of non-steroidal anti-inflammatories (NSAID): Secondary | ICD-10-CM | POA: Insufficient documentation

## 2013-01-04 DIAGNOSIS — M549 Dorsalgia, unspecified: Secondary | ICD-10-CM | POA: Insufficient documentation

## 2013-01-04 DIAGNOSIS — K0889 Other specified disorders of teeth and supporting structures: Secondary | ICD-10-CM

## 2013-01-04 DIAGNOSIS — F17200 Nicotine dependence, unspecified, uncomplicated: Secondary | ICD-10-CM | POA: Insufficient documentation

## 2013-01-04 MED ORDER — OXYCODONE-ACETAMINOPHEN 5-325 MG PO TABS: 1.0000 | ORAL_TABLET | Freq: Four times a day (QID) | ORAL | Status: DC | PRN

## 2013-01-04 MED ORDER — AMOXICILLIN 500 MG PO CAPS: 500.0000 mg | ORAL_CAPSULE | Freq: Three times a day (TID) | ORAL | Status: DC

## 2013-01-04 NOTE — ED Notes (Signed)
Pt c/o Left lower tooth pain starting x5 days.

## 2013-01-04 NOTE — ED Provider Notes (Signed)
CSN: [id number]     Arrival date & time 01/04/13  1808 History    This chart was scribed Dorthula Matas, for non-physician practitioner working with Gilda Crease, * by Leone Payor, ED Scribe. This patient was seen in room TR09C/TR09C and the patient's care was started at 1808.   First MD Initiated Contact with Patient 01/04/13 1818     Chief Complaint  Patient presents with  . Dental Pain    The history is provided by the patient. No language interpreter was used.    HPI Comments: Kathleen House is a 37 y.o. female who presents to the Emergency Department complaining of 5 days of gradual onset, gradually worsening, constant dental pain to left lower tooth. Pt states part of the filling may have fallen out from that particular tooth. States she is working on [employer] so she can follow up with a dentist to pull a few teeth. Denies fever, vomiting, diarrhea.    Past Medical History  Diagnosis Date  . MRSA (methicillin resistant Staphylococcus aureus)   . Kidney stone   . Back pain   . Thyroid disease    Past Surgical History  Procedure Laterality Date  . Parathyroidectomy    . Tubal ligation    . Hand surgery     History reviewed. No pertinent family history. History  Substance Use Topics  . Smoking status: Current Every Day Smoker    Types: Cigarettes  . Smokeless tobacco: Never Used  . Alcohol Use: No   OB History   Grav Para Term Preterm Abortions TAB SAB Ect Mult Living                 Review of Systems  Constitutional: Negative for fever.  HENT: Positive for dental problem. Negative for facial swelling.   Gastrointestinal: Negative for vomiting and diarrhea.  All other systems reviewed and are negative.    Allergies  Review of patient's allergies indicates no known allergies.  Home Medications   Current Outpatient Rx  Name  Route  Sig  Dispense  Refill  . ibuprofen (ADVIL,MOTRIN) 200 MG tablet   Oral   Take 800 mg by mouth every  6 (six) hours as needed for pain.         . naproxen (NAPROSYN) 500 MG tablet   Oral   Take 1 tablet (500 mg total) by mouth 2 (two) times daily.   30 tablet   0   . amoxicillin (AMOXIL) 500 MG capsule   Oral   Take 1 capsule (500 mg total) by mouth 3 (three) times daily.   21 capsule   0   . oxyCODONE-acetaminophen (PERCOCET/ROXICET) 5-325 MG per tablet   Oral   Take 1 tablet by mouth every 6 (six) hours as needed for pain.   15 tablet   0    BP 118/77  Pulse 77  Temp(Src) 97.9 F (36.6 C) (Oral)  Resp 18  SpO2 98% Physical Exam  Constitutional: She appears well-developed and well-nourished. No distress.  HENT:  Head: Normocephalic and atraumatic.  Mouth/Throat: Uvula is midline, oropharynx is clear and moist and mucous membranes are normal. Normal dentition. Dental caries (Pts tooth shows no obvious abscess but moderate to severe tenderness to palpation of marked tooth) present. No edematous.    Eyes: Pupils are equal, round, and reactive to light.  Neck: Trachea normal, normal range of motion and full passive range of motion without pain. Neck supple.  Cardiovascular: Normal rate, regular rhythm,  normal heart sounds and normal pulses.   Pulmonary/Chest: Effort normal and breath sounds normal. No respiratory distress. Chest wall is not dull to percussion. She exhibits no tenderness, no crepitus, no edema, no deformity and no retraction.  Abdominal: Normal appearance.  Musculoskeletal: Normal range of motion.  Neurological: She is alert. She has normal strength.  Skin: Skin is warm, dry and intact. She is not diaphoretic.  Psychiatric: She has a normal mood and affect. Her speech is normal. Cognition and memory are normal.    ED Course   Procedures (including critical care time) DIAGNOSTIC STUDIES: Oxygen Saturation is 98% on RA, normal by my interpretation.    COORDINATION OF CARE: 6:33 PM Discussed treatment plan with pt at bedside and pt agreed to plan.     Labs Reviewed - No data to display No results found. 1. Toothache     MDM  Patient has dental pain. No emergent s/sx's present. Patent airway. No trismus.  Will be given pain medication and antibiotics. I discussed the need to call dentist within 24/48 hours for follow-up. Dental referral given. Return to ED precautions given.  Pt voiced understanding and has agreed to follow-up.   I personally performed the services described in this documentation, which was scribed in my presence. The recorded information has been reviewed and is accurate.   Dorthula Matas, PA-C 01/04/13 1841

## 2013-01-05 NOTE — ED Provider Notes (Signed)
Medical screening examination/treatment/procedure(s) were performed by non-physician practitioner and as supervising physician I was immediately available for consultation/collaboration.    Namiko Pritts J. Guilianna Mckoy, MD 01/05/13 1544 

## 2013-01-30 ENCOUNTER — Emergency Department (HOSPITAL_COMMUNITY)
Admission: EM | Admit: 2013-01-30 | Discharge: 2013-01-30 | Disposition: A | Discharge status: Home / Self Care | Payer: Self-pay | Attending: Emergency Medicine | Admitting: Emergency Medicine

## 2013-01-30 ENCOUNTER — Encounter (HOSPITAL_COMMUNITY): Payer: Self-pay | Admitting: Emergency Medicine

## 2013-01-30 DIAGNOSIS — Y9289 Other specified places as the place of occurrence of the external cause: Secondary | ICD-10-CM | POA: Insufficient documentation

## 2013-01-30 DIAGNOSIS — M5380 Other specified dorsopathies, site unspecified: Secondary | ICD-10-CM | POA: Insufficient documentation

## 2013-01-30 DIAGNOSIS — S39012A Strain of muscle, fascia and tendon of lower back, initial encounter: Secondary | ICD-10-CM

## 2013-01-30 DIAGNOSIS — M62830 Muscle spasm of back: Secondary | ICD-10-CM

## 2013-01-30 DIAGNOSIS — M545 Low back pain, unspecified: Secondary | ICD-10-CM

## 2013-01-30 DIAGNOSIS — Z87442 Personal history of urinary calculi: Secondary | ICD-10-CM | POA: Insufficient documentation

## 2013-01-30 DIAGNOSIS — Y9301 Activity, walking, marching and hiking: Secondary | ICD-10-CM | POA: Insufficient documentation

## 2013-01-30 DIAGNOSIS — Z8614 Personal history of Methicillin resistant Staphylococcus aureus infection: Secondary | ICD-10-CM | POA: Insufficient documentation

## 2013-01-30 DIAGNOSIS — F17200 Nicotine dependence, unspecified, uncomplicated: Secondary | ICD-10-CM | POA: Insufficient documentation

## 2013-01-30 DIAGNOSIS — X503XXA Overexertion from repetitive movements, initial encounter: Secondary | ICD-10-CM | POA: Insufficient documentation

## 2013-01-30 DIAGNOSIS — M5431 Sciatica, right side: Secondary | ICD-10-CM

## 2013-01-30 DIAGNOSIS — S335XXA Sprain of ligaments of lumbar spine, initial encounter: Secondary | ICD-10-CM | POA: Insufficient documentation

## 2013-01-30 DIAGNOSIS — M549 Dorsalgia, unspecified: Secondary | ICD-10-CM

## 2013-01-30 DIAGNOSIS — Z862 Personal history of diseases of the blood and blood-forming organs and certain disorders involving the immune mechanism: Secondary | ICD-10-CM | POA: Insufficient documentation

## 2013-01-30 DIAGNOSIS — Z8639 Personal history of other endocrine, nutritional and metabolic disease: Secondary | ICD-10-CM | POA: Insufficient documentation

## 2013-01-30 MED ORDER — PREDNISONE 50 MG PO TABS: ORAL_TABLET | ORAL | Status: DC

## 2013-01-30 MED ORDER — HYDROCODONE-ACETAMINOPHEN 5-325 MG PO TABS: 1.0000 | ORAL_TABLET | Freq: Four times a day (QID) | ORAL | Status: DC | PRN

## 2013-01-30 MED ORDER — METHOCARBAMOL 750 MG PO TABS: 750.0000 mg | ORAL_TABLET | Freq: Four times a day (QID) | ORAL | Status: DC | PRN

## 2013-01-30 MED ORDER — IBUPROFEN 800 MG PO TABS: 800.0000 mg | ORAL_TABLET | Freq: Three times a day (TID) | ORAL | Status: DC | PRN

## 2013-01-30 NOTE — ED Provider Notes (Signed)
CSN: [id number]     Arrival date & time 01/30/13  1454 History     First MD Initiated Contact with Patient 01/30/13 1512     Chief Complaint  Patient presents with  . Back Pain   (Consider location/radiation/quality/duration/timing/severity/associated sxs/prior Treatment) Patient is a 37 y.o. female presenting with back pain. The history is provided by the patient and medical records. No language interpreter was used.  Back Pain Associated symptoms: no abdominal pain, no chest pain, no dysuria, no fever, no headaches, no numbness and no weakness     Kathleen House is a 37 y.o. female  with a hx of kidney stone, back pain presents to the Emergency Department complaining of gradual, persistent, progressively worsening low back pain beginning yesterday evening after going to a concert at [facility]. Associated symptoms include radiation of the pain throughout the right hip and into the right posterior thight.  Pt took flexeril without relief.  Nothing makes it better and moving and palpation makes it worse.  Pt denies fever, chills, headache, neck pain, chest pain, SOB, abd pain, N/V/D, weakness, numbness, tingling, gait disturbance, saddle anesthesia, loss of bowel or bladder control.  No dysuria, hematuria, frequency or urgency.    '  Past Medical History  Diagnosis Date  . MRSA (methicillin resistant Staphylococcus aureus)   . Kidney stone   . Back pain   . Thyroid disease    Past Surgical History  Procedure Laterality Date  . Parathyroidectomy    . Tubal ligation    . Hand surgery     No family history on file. History  Substance Use Topics  . Smoking status: Current Every Day Smoker    Types: Cigarettes  . Smokeless tobacco: Never Used  . Alcohol Use: No   OB History   Grav Para Term Preterm Abortions TAB SAB Ect Mult Living                 Review of Systems  Constitutional: Negative for fever and fatigue.  HENT: Negative for neck pain and neck stiffness.   Respiratory:  Negative for chest tightness and shortness of breath.   Cardiovascular: Negative for chest pain.  Gastrointestinal: Negative for nausea, vomiting, abdominal pain and diarrhea.  Genitourinary: Negative for dysuria, urgency, frequency and hematuria.  Musculoskeletal: Positive for back pain and gait problem ( 2/2 pain). Negative for joint swelling.  Skin: Negative for rash.  Neurological: Negative for weakness, light-headedness, numbness and headaches.  All other systems reviewed and are negative.    Allergies  Review of patient's allergies indicates no known allergies.  Home Medications   Current Outpatient Rx  Name  Route  Sig  Dispense  Refill  . amoxicillin (AMOXIL) 500 MG capsule   Oral   Take 1 capsule (500 mg total) by mouth 3 (three) times daily.   21 capsule   0   . HYDROcodone-acetaminophen (NORCO/VICODIN) 5-325 MG per tablet   Oral   Take 1 tablet by mouth every 6 (six) hours as needed for pain (Take 1 - 2 tablets every 4 - 6 hours.).   11 tablet   0   . ibuprofen (ADVIL,MOTRIN) 200 MG tablet   Oral   Take 800 mg by mouth every 6 (six) hours as needed for pain.         Marland Kitchen ibuprofen (ADVIL,MOTRIN) 800 MG tablet   Oral   Take 1 tablet (800 mg total) by mouth every 8 (eight) hours as needed for pain.   30  tablet   0   . methocarbamol (ROBAXIN) 750 MG tablet   Oral   Take 1 tablet (750 mg total) by mouth 4 (four) times daily as needed (Take 1 tablet every 6 hours as needed for muscle spasms.).   20 tablet   0   . naproxen (NAPROSYN) 500 MG tablet   Oral   Take 1 tablet (500 mg total) by mouth 2 (two) times daily.   30 tablet   0   . oxyCODONE-acetaminophen (PERCOCET/ROXICET) 5-325 MG per tablet   Oral   Take 1 tablet by mouth every 6 (six) hours as needed for pain.   15 tablet   0   . predniSONE (DELTASONE) 50 MG tablet      Take 1 a day for 7 days.   7 tablet   0    BP 114/74  Pulse 79  Temp(Src) 98.4 F (36.9 C) (Oral)  Resp 18  SpO2  100% Physical Exam  Nursing note and vitals reviewed. Constitutional: She appears well-developed and well-nourished. No distress.  HENT:  Head: Normocephalic and atraumatic.  Mouth/Throat: Oropharynx is clear and moist. No oropharyngeal exudate.  Eyes: Conjunctivae are normal.  Neck: Normal range of motion. Neck supple.  Full ROM without pain  Cardiovascular: Normal rate, regular rhythm and intact distal pulses.   Pulmonary/Chest: Effort normal and breath sounds normal. No respiratory distress. She has no wheezes.  Abdominal: Soft. She exhibits no distension. There is no tenderness. There is no CVA tenderness.  Musculoskeletal:  Full range of motion of the T-spine and L-spine No tenderness to palpation of the spinous processes of the T-spine or L-spine Mild tenderness to palpation of the bilateral paraspinous muscles of the L-spine with reproducible radiation into the right leg with palpation of the the right paraspinal muscle  Lymphadenopathy:    She has no cervical adenopathy.  Neurological: She is alert. She has normal reflexes.  Speech is clear and goal oriented, follows commands Normal strength in upper and lower extremities bilaterally including dorsiflexion and plantar flexion, strong and equal grip strength Sensation normal to light and sharp touch Moves extremities without ataxia, coordination intact Normal gait Normal balance   Skin: Skin is warm and dry. No rash noted. She is not diaphoretic. No erythema.    ED Course   Procedures (including critical care time)  Labs Reviewed - No data to display No results found. 1. Back pain   2. Back muscle spasm   3. Low back pain   4. Sciatica, right [724.3]   5. Strain of lumbar region, initial encounter [847.2]     MDM  Kathleen House presents with back pain after completing a lot of walking in a pair of shoes without proper support.  Patient with hx of low back pain and sciatica with symptoms similar to today's episode.   No neurological deficits and normal neuro exam.  Patient can walk but states it is somewhat painful.  No loss of bowel or bladder control.  No concern for cauda equina.  No fever, night sweats, weight loss, h/o cancer, IVDU.  No dysuria, hematuria, frequency, urgency, or CVA tenderness on exam; doubt renal colic or ureteral stone.  RICE protocol and pain medicine indicated and discussed with patient. I have also discussed reasons to return immediately to the ER.  Patient expresses understanding and agrees with plan.     Dahlia Client Breeze Angell, PA-C 01/31/13 [phone]

## 2013-01-30 NOTE — ED Notes (Signed)
Pt states that she has back problems and last night she "overdid it at a concert walking around last night".  C/o low back pain.

## 2013-02-01 NOTE — ED Provider Notes (Signed)
Medical screening examination/treatment/procedure(s) were performed by non-physician practitioner and as supervising physician I was immediately available for consultation/collaboration.   Lyanne Co, MD 02/01/13 2252

## 2013-03-01 ENCOUNTER — Encounter (HOSPITAL_COMMUNITY): Payer: Self-pay | Admitting: Emergency Medicine

## 2013-03-01 ENCOUNTER — Emergency Department (HOSPITAL_COMMUNITY)
Admission: EM | Admit: 2013-03-01 | Discharge: 2013-03-01 | Disposition: A | Discharge status: Home / Self Care | Payer: Self-pay | Attending: Emergency Medicine | Admitting: Emergency Medicine

## 2013-03-01 DIAGNOSIS — M5430 Sciatica, unspecified side: Secondary | ICD-10-CM | POA: Insufficient documentation

## 2013-03-01 DIAGNOSIS — M5431 Sciatica, right side: Secondary | ICD-10-CM

## 2013-03-01 DIAGNOSIS — Z8614 Personal history of Methicillin resistant Staphylococcus aureus infection: Secondary | ICD-10-CM | POA: Insufficient documentation

## 2013-03-01 DIAGNOSIS — Z862 Personal history of diseases of the blood and blood-forming organs and certain disorders involving the immune mechanism: Secondary | ICD-10-CM | POA: Insufficient documentation

## 2013-03-01 DIAGNOSIS — Z79899 Other long term (current) drug therapy: Secondary | ICD-10-CM | POA: Insufficient documentation

## 2013-03-01 DIAGNOSIS — Z87442 Personal history of urinary calculi: Secondary | ICD-10-CM | POA: Insufficient documentation

## 2013-03-01 DIAGNOSIS — F17200 Nicotine dependence, unspecified, uncomplicated: Secondary | ICD-10-CM | POA: Insufficient documentation

## 2013-03-01 DIAGNOSIS — G8929 Other chronic pain: Secondary | ICD-10-CM | POA: Insufficient documentation

## 2013-03-01 DIAGNOSIS — R209 Unspecified disturbances of skin sensation: Secondary | ICD-10-CM | POA: Insufficient documentation

## 2013-03-01 DIAGNOSIS — Z8639 Personal history of other endocrine, nutritional and metabolic disease: Secondary | ICD-10-CM | POA: Insufficient documentation

## 2013-03-01 MED ORDER — HYDROCODONE-ACETAMINOPHEN 5-325 MG PO TABS: 1.0000 | ORAL_TABLET | Freq: Four times a day (QID) | ORAL | Status: DC | PRN

## 2013-03-01 MED ORDER — PREDNISONE 20 MG PO TABS
60.0000 mg | ORAL_TABLET | Freq: Once | ORAL | Status: AC
  Administered 2013-03-01: 60 mg via ORAL
  Filled 2013-03-01: qty 3

## 2013-03-01 MED ORDER — PREDNISONE 20 MG PO TABS: ORAL_TABLET | ORAL | Status: DC

## 2013-03-01 MED ORDER — METHOCARBAMOL 500 MG PO TABS: 500.0000 mg | ORAL_TABLET | Freq: Two times a day (BID) | ORAL | Status: DC

## 2013-03-01 NOTE — ED Provider Notes (Signed)
CSN: [id number]     Arrival date & time 03/01/13  2012 History  This chart was scribed for non-physician practitioner Earley Favor, NP working with Celene Kras, MD by Danella Maiers, ED Scribe. This patient was seen in room WTR7/WTR7 and the patient's care was started at 9:15 PM.   Chief Complaint  Patient presents with  . Back Pain   The history is provided by the patient. No language interpreter was used.   HPI Comments: Kathleen House is a 37 y.o. female with a history of sciatica who presents to the Emergency Department complaining of exacerbation of her chronic lower back pain radiating to right buttocks and posterior right thigh. She states the pain has worsened in the last 3 days and describes it as burning. She has associated numbness to the buttock area.  She had a treatment last month of robaxin, naproxen, Vicodin, and prednisone that helped but it has worsened again. She denies urinary or bowel incontinence. She has had her tubes tied.   Past Medical History  Diagnosis Date  . MRSA (methicillin resistant Staphylococcus aureus)   . Kidney stone   . Back pain   . Thyroid disease    Past Surgical History  Procedure Laterality Date  . Parathyroidectomy    . Tubal ligation    . Hand surgery     History reviewed. No pertinent family history. History  Substance Use Topics  . Smoking status: Current Every Day Smoker    Types: Cigarettes  . Smokeless tobacco: Never Used  . Alcohol Use: No   OB History   Grav Para Term Preterm Abortions TAB SAB Ect Mult Living                 Review of Systems  Constitutional: Negative for fever.  Musculoskeletal: Positive for back pain. Negative for joint swelling.  Skin: Negative for rash.  Neurological: Positive for numbness. Negative for weakness.  All other systems reviewed and are negative.    Allergies  Review of patient's allergies indicates no known allergies.  Home Medications   Current Outpatient Rx  Name  Route  Sig   Dispense  Refill  . ibuprofen (ADVIL,MOTRIN) 200 MG tablet   Oral   Take 800 mg by mouth every 6 (six) hours as needed for pain.         . naproxen (NAPROSYN) 500 MG tablet   Oral   Take 500 mg by mouth 2 (two) times daily with a meal.         . HYDROcodone-acetaminophen (NORCO/VICODIN) 5-325 MG per tablet   Oral   Take 1 tablet by mouth every 6 (six) hours as needed for pain.   10 tablet   0   . methocarbamol (ROBAXIN) 500 MG tablet   Oral   Take 1 tablet (500 mg total) by mouth 2 (two) times daily.   20 tablet   0   . predniSONE (DELTASONE) 20 MG tablet      3 Tabs PO Days 1-3, then 2 tabs PO Days 4-6, then 1 tab PO Day 7-9, then Half Tab PO Day 10-12   20 tablet   0    BP 140/73  Pulse 103  Temp(Src) 98.6 F (37 C) (Oral)  Resp 20  Ht 5\' 9"  (1.753 m)  Wt 210 lb (95.255 kg)  BMI 31 kg/m2  SpO2 98%  LMP 01/29/2013 Physical Exam  Nursing note and vitals reviewed. Constitutional: She is oriented to person, place, and time. She appears  well-developed and well-nourished. No distress.  HENT:  Head: Normocephalic.  Eyes: Pupils are equal, round, and reactive to light.  Neck: Normal range of motion.  Cardiovascular: Normal rate and regular rhythm.   Pulmonary/Chest: Effort normal and breath sounds normal.  Musculoskeletal: Normal range of motion. She exhibits no tenderness.  Neurological: She is alert and oriented to person, place, and time.  Skin: Skin is warm and dry. No rash noted.  Psychiatric: She has a normal mood and affect. Her behavior is normal.    ED Course  Procedures (including critical care time) Medications  predniSONE (DELTASONE) tablet 60 mg (not administered)    DIAGNOSTIC STUDIES: Oxygen Saturation is 98% on room air, normal by my interpretation.    COORDINATION OF CARE: 9:28 PM- Discussed treatment plan with pt and pt agrees to plan.    Labs Review Labs Reviewed - No data to display Imaging Review No results found.  MDM   1.  Sciatic neuralgia, right     Repeat medication regime from last month, says it's due to the patient's significant relief.  We'll also refer her to the [facility].  She does not have insurance, and is having trouble finding medical care in the community  I personally performed the services described in this documentation, which was scribed in my presence. The recorded information has been reviewed and is accurate.       Arman Filter, NP 03/01/13 2152

## 2013-03-01 NOTE — ED Notes (Signed)
ED provider with patient at this time

## 2013-03-01 NOTE — ED Notes (Signed)
Pt reports she was evaluated for sciatica which is continuing at this time.

## 2013-03-02 NOTE — ED Provider Notes (Signed)
Medical screening examination/treatment/procedure(s) were performed by non-physician practitioner and as supervising physician I was immediately available for consultation/collaboration.    Dorine Duffey R Dina Warbington, MD 03/02/13 1537 

## 2013-04-27 ENCOUNTER — Encounter (HOSPITAL_COMMUNITY): Payer: Self-pay | Admitting: Emergency Medicine

## 2013-04-27 ENCOUNTER — Emergency Department (HOSPITAL_COMMUNITY)
Admission: EM | Admit: 2013-04-27 | Discharge: 2013-04-27 | Disposition: A | Discharge status: Home / Self Care | Payer: Self-pay | Attending: Emergency Medicine | Admitting: Emergency Medicine

## 2013-04-27 ENCOUNTER — Emergency Department (HOSPITAL_COMMUNITY): Payer: Self-pay

## 2013-04-27 DIAGNOSIS — Z87442 Personal history of urinary calculi: Secondary | ICD-10-CM | POA: Insufficient documentation

## 2013-04-27 DIAGNOSIS — M6740 Ganglion, unspecified site: Secondary | ICD-10-CM | POA: Insufficient documentation

## 2013-04-27 DIAGNOSIS — R209 Unspecified disturbances of skin sensation: Secondary | ICD-10-CM | POA: Insufficient documentation

## 2013-04-27 DIAGNOSIS — M67432 Ganglion, left wrist: Secondary | ICD-10-CM

## 2013-04-27 DIAGNOSIS — M25532 Pain in left wrist: Secondary | ICD-10-CM

## 2013-04-27 DIAGNOSIS — Z8614 Personal history of Methicillin resistant Staphylococcus aureus infection: Secondary | ICD-10-CM | POA: Insufficient documentation

## 2013-04-27 DIAGNOSIS — Z862 Personal history of diseases of the blood and blood-forming organs and certain disorders involving the immune mechanism: Secondary | ICD-10-CM | POA: Insufficient documentation

## 2013-04-27 DIAGNOSIS — Z87891 Personal history of nicotine dependence: Secondary | ICD-10-CM | POA: Insufficient documentation

## 2013-04-27 DIAGNOSIS — Z8639 Personal history of other endocrine, nutritional and metabolic disease: Secondary | ICD-10-CM | POA: Insufficient documentation

## 2013-04-27 DIAGNOSIS — M7710 Lateral epicondylitis, unspecified elbow: Secondary | ICD-10-CM | POA: Insufficient documentation

## 2013-04-27 MED ORDER — IBUPROFEN 600 MG PO TABS: 600.0000 mg | ORAL_TABLET | Freq: Four times a day (QID) | ORAL | Status: DC | PRN

## 2013-04-27 NOTE — ED Provider Notes (Signed)
CSN: [id number]     Arrival date & time 04/27/13  1640 History  This chart was scribed for non-physician practitioner, Junius Finner, PA-C,working with Raeford Razor, MD, by Karle Plumber, ED Scribe.  This patient was seen in room TR10C/TR10C and the patient's care was started at 6:28 PM.  Chief Complaint  Patient presents with  . Wrist Pain   The history is provided by the patient. No language interpreter was used.   HPI Comments:  Kathleen House is a 37 y.o. female who presents to the Emergency Department complaining of severe left wrist pain onset this morning. Pt reports associated swelling, numbness and a nodule have appeared earlier. She states the pain worsens with movement. Has not taken anything for pain. Pt reports left tennis elbow, but denies any new elbow injury. She denies any new or recent injuries to the wrist. Pt states she is right-handed. She reports not having a PCP.   Past Medical History  Diagnosis Date  . MRSA (methicillin resistant Staphylococcus aureus)   . Kidney stone   . Back pain   . Thyroid disease    Past Surgical History  Procedure Laterality Date  . Parathyroidectomy    . Tubal ligation    . Hand surgery     No family history on file. History  Substance Use Topics  . Smoking status: Former Smoker    Types: Cigarettes    Quit date: 04/17/2013  . Smokeless tobacco: Never Used  . Alcohol Use: No   OB History   Grav Para Term Preterm Abortions TAB SAB Ect Mult Living                 Review of Systems  Musculoskeletal: Positive for joint swelling (left wrist).  All other systems reviewed and are negative.   Allergies  Review of patient's allergies indicates no known allergies.  Home Medications   Current Outpatient Rx  Name  Route  Sig  Dispense  Refill  . ibuprofen (ADVIL,MOTRIN) 600 MG tablet   Oral   Take 1 tablet (600 mg total) by mouth every 6 (six) hours as needed.   30 tablet   0    Triage Vitals: BP 121/64  Pulse 82   Temp(Src) 98.1 F (36.7 C) (Oral)  Resp 18  Wt 218 lb (98.884 kg)  SpO2 98%  LMP 03/30/2013 Physical Exam  Nursing note and vitals reviewed. Constitutional: She is oriented to person, place, and time. She appears well-developed and well-nourished.  HENT:  Head: Normocephalic and atraumatic.  Eyes: EOM are normal.  Neck: Normal range of motion.  Cardiovascular: Normal rate.   Pulmonary/Chest: Effort normal.  Musculoskeletal: Normal range of motion. She exhibits edema and tenderness.  Left wrist 1 cm palpable nodule on dorsal aspect. TTP. No erythema or warmth. Pain with full wrist extension. Normal sensation to light touch. 5/5 grip strength. Radial pulse 2+.   Neurological: She is alert and oriented to person, place, and time.  Skin: Skin is warm and dry.  Psychiatric: She has a normal mood and affect. Her behavior is normal.    ED Course  Procedures (including critical care time) DIAGNOSTIC STUDIES: Oxygen Saturation is 98% on RA, normal by my interpretation.   COORDINATION OF CARE: 6:32 PM- Will provide a wrist splint and a prescription for anti-inflammatory pain medications. Will provide pt with referral to a hand surgeon. Pt verbalizes understanding and agrees to plan.  Medications - No data to display  Labs Review Labs Reviewed - No data  to display Imaging Review Dg Wrist Complete Left  04/27/2013   CLINICAL DATA:  Bony protrusion, pain.  EXAM: LEFT WRIST - COMPLETE 3+ VIEW  COMPARISON:  08/15/2012.  FINDINGS: There is no evidence of fracture or dislocation. There is no evidence of arthropathy or other focal bone abnormality. Soft tissues are unremarkable.  Large accessory ossicle at the ulnar styloid level remains unchanged. No acute osseous process.  IMPRESSION: Negative.  Large accessory ossicle off the ulnar styloid.   Electronically Signed   By: Davonna Belling M.D.   On: 04/27/2013 17:57    EKG Interpretation   None       MDM   1. Left wrist pain   2. Ganglion  cyst of wrist, left    Pt c/o left wrist pain, signs and symptoms consistent with ganglion cyst. Will tx pain conservatively with wrist splint, NSAIDs and warm compresses. F/u with [facility] in 2-3 weeks if pain not improving. Rx: ibuprofen. Return precautions provided. Pt verbalized understanding and agreement with tx plan.   I personally performed the services described in this documentation, which was scribed in my presence. The recorded information has been reviewed and is accurate.    Junius Finner, PA-C 04/27/13 [phone]

## 2013-04-27 NOTE — ED Notes (Signed)
Pt discharged.Vital signs stable and GCS 15.Discharge instruction given. 
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
## 2013-04-28 NOTE — ED Provider Notes (Signed)
Medical screening examination/treatment/procedure(s) were performed by non-physician practitioner and as supervising physician I was immediately available for consultation/collaboration.  EKG Interpretation   None        Raeford Razor, MD 04/28/13 0101

## 2013-05-13 ENCOUNTER — Emergency Department (HOSPITAL_COMMUNITY): Payer: Self-pay

## 2013-05-13 ENCOUNTER — Emergency Department (HOSPITAL_COMMUNITY)
Admission: EM | Admit: 2013-05-13 | Discharge: 2013-05-13 | Disposition: A | Discharge status: Home / Self Care | Payer: Self-pay | Attending: Emergency Medicine | Admitting: Emergency Medicine

## 2013-05-13 ENCOUNTER — Encounter (HOSPITAL_COMMUNITY): Payer: Self-pay | Admitting: Emergency Medicine

## 2013-05-13 DIAGNOSIS — Z87891 Personal history of nicotine dependence: Secondary | ICD-10-CM | POA: Insufficient documentation

## 2013-05-13 DIAGNOSIS — S4980XA Other specified injuries of shoulder and upper arm, unspecified arm, initial encounter: Secondary | ICD-10-CM | POA: Insufficient documentation

## 2013-05-13 DIAGNOSIS — Z8614 Personal history of Methicillin resistant Staphylococcus aureus infection: Secondary | ICD-10-CM | POA: Insufficient documentation

## 2013-05-13 DIAGNOSIS — S4991XA Unspecified injury of right shoulder and upper arm, initial encounter: Secondary | ICD-10-CM

## 2013-05-13 DIAGNOSIS — S46909A Unspecified injury of unspecified muscle, fascia and tendon at shoulder and upper arm level, unspecified arm, initial encounter: Secondary | ICD-10-CM | POA: Insufficient documentation

## 2013-05-13 DIAGNOSIS — X500XXA Overexertion from strenuous movement or load, initial encounter: Secondary | ICD-10-CM | POA: Insufficient documentation

## 2013-05-13 DIAGNOSIS — Z87442 Personal history of urinary calculi: Secondary | ICD-10-CM | POA: Insufficient documentation

## 2013-05-13 DIAGNOSIS — Z8639 Personal history of other endocrine, nutritional and metabolic disease: Secondary | ICD-10-CM | POA: Insufficient documentation

## 2013-05-13 DIAGNOSIS — Y9389 Activity, other specified: Secondary | ICD-10-CM | POA: Insufficient documentation

## 2013-05-13 DIAGNOSIS — Y929 Unspecified place or not applicable: Secondary | ICD-10-CM | POA: Insufficient documentation

## 2013-05-13 DIAGNOSIS — Z862 Personal history of diseases of the blood and blood-forming organs and certain disorders involving the immune mechanism: Secondary | ICD-10-CM | POA: Insufficient documentation

## 2013-05-13 MED ORDER — NAPROXEN 500 MG PO TABS: 500.0000 mg | ORAL_TABLET | Freq: Two times a day (BID) | ORAL | Status: DC

## 2013-05-13 MED ORDER — CYCLOBENZAPRINE HCL 10 MG PO TABS: 10.0000 mg | ORAL_TABLET | Freq: Two times a day (BID) | ORAL | Status: DC | PRN

## 2013-05-13 NOTE — ED Notes (Signed)
Pt states she injured her R shoulder about a week ago when her she was walking her 100 lb dog and the dog lunged forward and pulled her shoulder. Pt states now the shoulder is popping and feels painful. ROM intact, but painful.

## 2013-05-13 NOTE — ED Provider Notes (Signed)
CSN: [id number]     Arrival date & time 05/13/13  1800 History   This chart was scribed for Emilia Beck, PA  by Ladona Ridgel Day, ED scribe. This patient was seen in room WTR6/WTR6 and the patient's care was started at 1800.    Chief Complaint  Patient presents with  . Shoulder Pain    Patient is a 37 y.o. female presenting with shoulder pain. The history is provided by the patient. No language interpreter was used.  Shoulder Pain This is a new problem. The current episode started more than 2 days ago. The problem occurs constantly. The problem has not changed since onset.Pertinent negatives include no chest pain, no abdominal pain, no headaches and no shortness of breath. The symptoms are aggravated by bending. Nothing relieves the symptoms. She has tried nothing for the symptoms.   HPI Comments: Kathleen House is a 37 y.o. female who presents to the Emergency Department complaining of constant, right shoulder pain and soreness, onset 1 week and a half ago after her dog pulled hard while she was holding the leash. She states her shoulder has a popping sensation at times and a burning pain. She reports today her shoulder popped again and the pain increased. She states increased pain w/ROM or elevation/depression of her shoulder. She denies numbness/tinigling distal to shoulder. She has not taken any medicines at home for this problem.   Past Medical History  Diagnosis Date  . MRSA (methicillin resistant Staphylococcus aureus)   . Kidney stone   . Back pain   . Thyroid disease    Past Surgical History  Procedure Laterality Date  . Parathyroidectomy    . Tubal ligation    . Hand surgery     No family history on file. History  Substance Use Topics  . Smoking status: Former Smoker    Types: Cigarettes    Quit date: 04/17/2013  . Smokeless tobacco: Never Used  . Alcohol Use: No   OB History   Grav Para Term Preterm Abortions TAB SAB Ect Mult Living                 Review of Systems   Constitutional: Negative for fever and chills.  HENT: Negative for congestion and rhinorrhea.   Respiratory: Negative for cough and shortness of breath.   Cardiovascular: Negative for chest pain.  Gastrointestinal: Negative for nausea, vomiting, abdominal pain and diarrhea.  Musculoskeletal: Negative for back pain.       Right shoulder pain/soreness  Skin: Negative for color change and rash.  Neurological: Negative for syncope and headaches.  All other systems reviewed and are negative.  A complete 10 system review of systems was obtained and all systems are negative except as noted in the HPI and PMH.   Allergies  Review of patient's allergies indicates no known allergies.  Home Medications   Current Outpatient Rx  Name  Route  Sig  Dispense  Refill  . ibuprofen (ADVIL,MOTRIN) 200 MG tablet   Oral   Take 600 mg by mouth every 6 (six) hours as needed (pain).          Triage Vitals: BP 134/57  Pulse 72  Temp(Src) 99.1 F (37.3 C) (Oral)  Resp 16  SpO2 99%  LMP 04/20/2013  Physical Exam  Nursing note and vitals reviewed. Constitutional: She is oriented to person, place, and time. She appears well-developed and well-nourished. No distress.  HENT:  Head: Normocephalic and atraumatic.  Eyes: Conjunctivae are normal. Right eye exhibits no  discharge. Left eye exhibits no discharge.  Neck: Normal range of motion. No tracheal deviation present.  Cardiovascular: Normal rate.   Pulmonary/Chest: Effort normal. No respiratory distress.  Musculoskeletal: Normal range of motion. She exhibits no edema.  Neurological: She is alert and oriented to person, place, and time.  Skin: Skin is warm and dry.  Psychiatric: She has a normal mood and affect. Thought content normal.    ED Course  Procedures (including critical care time) DIAGNOSTIC STUDIES: Oxygen Saturation is 99% on room air, normal by my interpretation.    COORDINATION OF CARE: At 720 PM Discussed treatment plan with  patient which includes right shoulder X-ray. Patient agrees.   Labs Review Labs Reviewed - No data to display Imaging Review Dg Shoulder Right  05/13/2013   CLINICAL DATA:  Right shoulder pain with popping and painful range of motion.  EXAM: RIGHT SHOULDER - 2+ VIEW  COMPARISON:  None.  FINDINGS: There is no evidence of fracture or dislocation. There is no evidence of arthropathy or other focal bone abnormality. Surgical clips adjacent to the right side of the trachea consistent with prior thyroid surgery.  IMPRESSION: Normal right shoulder.   Electronically Signed   By: Geanie Cooley M.D.   On: 05/13/2013 19:00    EKG Interpretation   None       MDM   1. Right shoulder injury, initial encounter     7:56 PM No acute changes noted on xray. Patient will be discharged with Naprosyn and flexeril. No neurovascular changes.   I personally performed the services described in this documentation, which was scribed in my presence. The recorded information has been reviewed and is accurate.   Emilia Beck, [place] 05/13/13 [phone]

## 2013-09-04 ENCOUNTER — Encounter (HOSPITAL_COMMUNITY): Payer: Self-pay | Admitting: Emergency Medicine

## 2013-09-04 ENCOUNTER — Emergency Department (HOSPITAL_COMMUNITY): Payer: Self-pay

## 2013-09-04 ENCOUNTER — Emergency Department (HOSPITAL_COMMUNITY)
Admission: EM | Admit: 2013-09-04 | Discharge: 2013-09-04 | Disposition: A | Discharge status: Home / Self Care | Payer: Self-pay | Attending: Emergency Medicine | Admitting: Emergency Medicine

## 2013-09-04 DIAGNOSIS — Z87828 Personal history of other (healed) physical injury and trauma: Secondary | ICD-10-CM | POA: Insufficient documentation

## 2013-09-04 DIAGNOSIS — M549 Dorsalgia, unspecified: Secondary | ICD-10-CM | POA: Insufficient documentation

## 2013-09-04 DIAGNOSIS — G5620 Lesion of ulnar nerve, unspecified upper limb: Secondary | ICD-10-CM | POA: Insufficient documentation

## 2013-09-04 DIAGNOSIS — Z862 Personal history of diseases of the blood and blood-forming organs and certain disorders involving the immune mechanism: Secondary | ICD-10-CM | POA: Insufficient documentation

## 2013-09-04 DIAGNOSIS — Z87442 Personal history of urinary calculi: Secondary | ICD-10-CM | POA: Insufficient documentation

## 2013-09-04 DIAGNOSIS — Z8614 Personal history of Methicillin resistant Staphylococcus aureus infection: Secondary | ICD-10-CM | POA: Insufficient documentation

## 2013-09-04 DIAGNOSIS — M25519 Pain in unspecified shoulder: Secondary | ICD-10-CM | POA: Insufficient documentation

## 2013-09-04 DIAGNOSIS — Z87891 Personal history of nicotine dependence: Secondary | ICD-10-CM | POA: Insufficient documentation

## 2013-09-04 DIAGNOSIS — Z8639 Personal history of other endocrine, nutritional and metabolic disease: Secondary | ICD-10-CM | POA: Insufficient documentation

## 2013-09-04 MED ORDER — NAPROXEN 500 MG PO TABS: 500.0000 mg | ORAL_TABLET | Freq: Two times a day (BID) | ORAL | Status: DC

## 2013-09-04 NOTE — ED Notes (Signed)
Patient reports that she feel on the ice a whole ago onto her right side, the patient reports that she is having pain to her right shoulder and having intermittent numbness - movement makes that numbness go away

## 2013-09-04 NOTE — Discharge Instructions (Signed)
Rob [place], LaC, LMBT [facility] is located within [facility]  [address]  [place], [place] [address]  Phone:[phone]  Email: [email or web address]   Dr. Denice ParadiseAaron [place]leside, DPT, lmbt [phone]  Shoulder Pain The shoulder is the joint that connects your arms to your body. The bones that form the shoulder joint include the upper arm bone (humerus), the shoulder blade (scapula), and the collarbone (clavicle). The top of the humerus is shaped like a ball and fits into a rather flat socket on the scapula (glenoid cavity). A combination of muscles and strong, fibrous tissues that connect muscles to bones (tendons) support your shoulder joint and hold the ball in the socket. Small, fluid-filled sacs (bursae) are located in different areas of the joint. They act as cushions between the bones and the overlying soft tissues and help reduce friction between the gliding tendons and the bone as you move your arm. Your shoulder joint allows a wide range of motion in your arm. This range of motion allows you to do things like scratch your back or throw a ball. However, this range of motion also makes your shoulder more prone to pain from overuse and injury. Causes of shoulder pain can originate from both injury and overuse and usually can be grouped in the following four categories:  Redness, swelling, and pain (inflammation) of the tendon (tendinitis) or the bursae (bursitis).  Instability, such as a dislocation of the joint.  Inflammation of the joint (arthritis).  Broken bone (fracture). HOME CARE INSTRUCTIONS   Apply ice to the sore area.  Put ice in a plastic bag.  Place a towel between your skin and the bag.  Leave the ice on for 15-20 minutes, 03-04 times per day for the first 2 days.  Stop using cold packs if they do not help with the pain.  If you have a shoulder sling or immobilizer, wear it as long as your caregiver instructs.  Only remove it to shower or bathe. Move your arm as little as possible, but keep your hand moving to prevent swelling.  Squeeze a soft ball or foam pad as much as possible to help prevent swelling.  Only take over-the-counter or prescription medicines for pain, discomfort, or fever as directed by your caregiver. SEEK MEDICAL CARE IF:   Your shoulder pain increases, or new pain develops in your arm, hand, or fingers.  Your hand or fingers become cold and numb.  Your pain is not relieved with medicines. SEEK IMMEDIATE MEDICAL CARE IF:   Your arm, hand, or fingers are numb or tingling.  Your arm, hand, or fingers are significantly swollen or turn white or blue. MAKE SURE YOU:   Understand these instructions.  Will watch your condition.  Will get help right away if you are not doing well or get worse. Document Released: 03/05/2005 Document Revised: 02/18/2012 Document Reviewed: 05/10/2011 [facility] Patient Information 2014 [employer] [place], [place].

## 2013-09-04 NOTE — ED Provider Notes (Signed)
CSN: [id number]     Arrival date & time 09/04/13  1341 History   First MD Initiated Contact with Patient 09/04/13 1421    This chart was scribed for non-physician practitioner, Arthor CaptainAbigail Taksh Hjort, working with Richardean Canalavid H Yao, MD by Marica OtterNusrat Rahman, ED Scribe. This patient was seen in room WTR8/WTR8 and the patient's care was started at 3:24 PM.  PCP: No PCP Per Patient  Chief Complaint  Patient presents with  . Shoulder Pain   HPI HPI Comments: Kathleen House is a 38 y.o. female, with a history of back pain, who presents to the Emergency Department complaining of constant right shoulder pain onset one month ago. Pt also complains of associated numbness of the right arm and right digits. Pt describes the pain as alternating between a burning sensation and popping sensation of the right arm. Pt complains that the pain radiates to the right side of her back. Pt reports that she fell on the ice onto her right side approximately one month ago. Pt denies any history of neck injury. Pt reports that she has been taking 4-5 pills of Ibuprofen in the morning, daily for her pain. 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   Past Medical History  Diagnosis Date  . MRSA (methicillin resistant Staphylococcus aureus)   . Kidney stone   . Back pain   . Thyroid disease    Past Surgical History  Procedure Laterality Date  . Parathyroidectomy    . Tubal ligation    . Hand surgery     History reviewed. No pertinent family history. History  Substance Use Topics  . Smoking status: Former Smoker    Types: Cigarettes    Quit date: 04/17/2013  . Smokeless tobacco: Never Used  . Alcohol Use: No   OB History   Grav Para Term Preterm Abortions TAB SAB Ect Mult Living                 Review of Systems  Musculoskeletal: Positive for back pain. Negative for neck pain.       Right shoulder pain.  Right arm pain      Allergies  Review of patient's allergies indicates no known allergies.  Home  Medications   Current Outpatient Rx  Name  Route  Sig  Dispense  Refill  . ibuprofen (ADVIL,MOTRIN) 200 MG tablet   Oral   Take 600 mg by mouth every 6 (six) hours as needed (pain).          BP 128/74  Pulse 75  Temp(Src) 98.7 F (37.1 C) (Oral)  Resp 16  SpO2 99%  LMP 08/06/2013 Physical Exam  Nursing note and vitals reviewed. Constitutional: She is oriented to person, place, and time. She appears well-developed and well-nourished. No distress.  HENT:  Head: Normocephalic and atraumatic.  Eyes: EOM are normal.  Neck: Neck supple. No tracheal deviation present.  Cardiovascular: Normal rate.   Pulmonary/Chest: Effort normal. No respiratory distress.  Musculoskeletal: Normal range of motion.  Neurological: She is alert and oriented to person, place, and time.  Skin: Skin is warm and dry.  Psychiatric: She has a normal mood and affect. Her behavior is normal.    ED Course  Procedures (including critical care time) DIAGNOSTIC STUDIES: Oxygen Saturation is 99% on RA, normal by my interpretation.    COORDINATION OF CARE:  3:32 PM-Discussed treatment plan which includes referral to warm, wet compresses to the effected area; alternating heat and ice to the affected area; orthopedist;  or massage therapy with pt at bedside and pt agreed to plan.   Labs Review Labs Reviewed - No data to display Imaging Review Dg Shoulder Right  09/04/2013   CLINICAL DATA:  Right shoulder pain.  EXAM: RIGHT SHOULDER - 2+ VIEW  COMPARISON:  05/13/2013  FINDINGS: Negative for a fracture or dislocation. The right AC joint is intact. Visualized right ribs are intact.  IMPRESSION: Negative right shoulder examination.   Electronically Signed   By: Richarda Overlie M.D.   On: 09/04/2013 14:24    EKG Interpretation None      MDM   Final diagnoses:  Shoulder pain  Ulnar nerve neuropathy    Patient X-Ray negative for obvious fracture or dislocation. Pain managed in ED. Pt advised to follow up with  orthopedics if symptoms persist for possibility of missed fracture diagnosis. Patient given brace while in ED, conservative therapy recommended and discussed. Patient will be dc home & is agreeable with above plan.   I personally performed the services described in this documentation, which was scribed in my presence. The recorded information has been reviewed and is accurate.   Arthor Captain, PA-C 09/05/13 0710

## 2013-09-07 NOTE — ED Provider Notes (Signed)
Medical screening examination/treatment/procedure(s) were performed by non-physician practitioner and as supervising physician I was immediately available for consultation/collaboration.   EKG Interpretation None        David H Yao, MD 09/07/13 1500 

## 2013-11-22 ENCOUNTER — Emergency Department (HOSPITAL_COMMUNITY)
Admission: EM | Admit: 2013-11-22 | Discharge: 2013-11-22 | Disposition: A | Discharge status: Home / Self Care | Payer: Self-pay | Attending: Emergency Medicine | Admitting: Emergency Medicine

## 2013-11-22 ENCOUNTER — Encounter (HOSPITAL_COMMUNITY): Payer: Self-pay | Admitting: Emergency Medicine

## 2013-11-22 DIAGNOSIS — Z8639 Personal history of other endocrine, nutritional and metabolic disease: Secondary | ICD-10-CM | POA: Insufficient documentation

## 2013-11-22 DIAGNOSIS — Z791 Long term (current) use of non-steroidal anti-inflammatories (NSAID): Secondary | ICD-10-CM | POA: Insufficient documentation

## 2013-11-22 DIAGNOSIS — Z862 Personal history of diseases of the blood and blood-forming organs and certain disorders involving the immune mechanism: Secondary | ICD-10-CM | POA: Insufficient documentation

## 2013-11-22 DIAGNOSIS — Z87891 Personal history of nicotine dependence: Secondary | ICD-10-CM | POA: Insufficient documentation

## 2013-11-22 DIAGNOSIS — L03119 Cellulitis of unspecified part of limb: Principal | ICD-10-CM

## 2013-11-22 DIAGNOSIS — L0390 Cellulitis, unspecified: Secondary | ICD-10-CM

## 2013-11-22 DIAGNOSIS — Z87442 Personal history of urinary calculi: Secondary | ICD-10-CM | POA: Insufficient documentation

## 2013-11-22 DIAGNOSIS — Z8614 Personal history of Methicillin resistant Staphylococcus aureus infection: Secondary | ICD-10-CM | POA: Insufficient documentation

## 2013-11-22 DIAGNOSIS — L02419 Cutaneous abscess of limb, unspecified: Secondary | ICD-10-CM | POA: Insufficient documentation

## 2013-11-22 MED ORDER — CLINDAMYCIN HCL 300 MG PO CAPS
300.0000 mg | ORAL_CAPSULE | Freq: Once | ORAL | Status: AC
  Administered 2013-11-22: 300 mg via ORAL
  Filled 2013-11-22: qty 1

## 2013-11-22 MED ORDER — CLINDAMYCIN HCL 150 MG PO CAPS: 300.0000 mg | ORAL_CAPSULE | Freq: Four times a day (QID) | ORAL | Status: AC

## 2013-11-22 NOTE — ED Provider Notes (Signed)
CSN: [id number]     Arrival date & time 11/22/13  1541 History   First MD Initiated Contact with Patient 11/22/13 1550     Chief Complaint  Patient presents with  . Insect Bite     (Consider location/radiation/quality/duration/timing/severity/associated sxs/prior Treatment) HPI Comments: Patient is a 38 year old female with history of MRSA who presents today with an insect bite. She states on Thursday she was working in an area with a lot of spiders and believes she got bit. Initially the area expanding, reaching it's peak redness on Friday. Saturday she began to have improvement. There is a "sore" pain, worse with palpation at the site. She has associated nausea, but states she is unsure if this is due to the heat outside. No fevers, chills, shortness of breath, myalgias.   The history is provided by the patient. No language interpreter was used.    Past Medical History  Diagnosis Date  . MRSA (methicillin resistant Staphylococcus aureus)   . Kidney stone   . Back pain   . Thyroid disease    Past Surgical History  Procedure Laterality Date  . Parathyroidectomy    . Tubal ligation    . Hand surgery     No family history on file. History  Substance Use Topics  . Smoking status: Former Smoker    Types: Cigarettes    Quit date: 04/17/2013  . Smokeless tobacco: Never Used  . Alcohol Use: No   OB History   Grav Para Term Preterm Abortions TAB SAB Ect Mult Living                 Review of Systems  Constitutional: Negative for fever and chills.  Respiratory: Negative for shortness of breath.   Cardiovascular: Negative for chest pain.  Gastrointestinal: Positive for nausea. Negative for vomiting and diarrhea.  Skin: Positive for color change.  All other systems reviewed and are negative.     Allergies  Review of patient's allergies indicates no known allergies.  Home Medications   Prior to Admission medications   Medication Sig Start Date End Date Taking?  Authorizing Provider  naproxen (NAPROSYN) 500 MG tablet Take 1 tablet (500 mg total) by mouth 2 (two) times daily with a meal. 09/04/13   Arthor CaptainAbigail Harris, PA-C   BP 117/77  Pulse 77  Temp(Src) 98.6 F (37 C) (Oral)  Resp 18  SpO2 99% Physical Exam  Nursing note and vitals reviewed. Constitutional: She is oriented to person, place, and time. She appears well-developed and well-nourished. No distress.  Well appearing  HENT:  Head: Normocephalic and atraumatic.  Right Ear: External ear normal.  Left Ear: External ear normal.  Nose: Nose normal.  Mouth/Throat: Oropharynx is clear and moist.  Eyes: Conjunctivae are normal.  Neck: Normal range of motion.  Cardiovascular: Normal rate, regular rhythm and normal heart sounds.   Pulmonary/Chest: Effort normal and breath sounds normal. No stridor. No respiratory distress. She has no wheezes. She has no rales.  Abdominal: Soft. She exhibits no distension.  Musculoskeletal: Normal range of motion.  Neurological: She is alert and oriented to person, place, and time. She has normal strength.  Skin: Skin is warm and dry. She is not diaphoretic. No erythema.  3 cm area of induration on anterior aspect of left shin. No fluctuance or streaking.   Psychiatric: She has a normal mood and affect. Her behavior is normal.    ED Course  US bedside Date/Time: 11/22/2013 4:10 PM Performed by: Mora BellmanMERRELL, Chirsty Armistead S  Authorized by: Mora BellmanMERRELL, Safina Huard S Consent: Verbal consent obtained. written consent not obtained. The procedure was performed in an emergent situation. Risks and benefits: risks, benefits and alternatives were discussed Consent given by: patient Patient understanding: patient states understanding of the procedure being performed Patient consent: the patient's understanding of the procedure matches consent given Required items: required blood products, implants, devices, and special equipment available Patient identity confirmed: verbally with patient  and arm band Time out: Immediately prior to procedure a "time out" was called to verify the correct patient, procedure, equipment, support staff and site/side marked as required. Local anesthesia used: no Patient sedated: no Comments: No fluid collections seen on US. No drainable abscess.    (including critical care time) Labs Review Labs Reviewed - No data to display  Imaging Review No results found.   EKG Interpretation None      MDM   Final diagnoses:  Cellulitis    Suspect uncomplicated cellulitis based on limited area of involvement, minimal pain, no systemic signs of illness (eg, fever, chills, dehydration, altered mental status, tachypnea, tachycardia, hypotension), no risk factors for serious illness (eg, extremes of age, general debility, immunocompromised status).   PE reveals redness, swelling, mildly tender, warm to touch. Skin intact, No bleeding. No bullae. Non purulent. Non circumferential.  Borders are not elevated or sharply demarcated.  Preformed ultrasound and did not detect any occult abscess. Drew a line around the area of infection. Pt was instructed to return to the ED if area surpasses the boarder or pain intensifies. Prescribed clindamycin for MRSA coverage.      Mora BellmanHannah S Tristyn Pharris, PA-C 11/22/13 1640

## 2013-11-22 NOTE — ED Notes (Signed)
Pt has bump on left lower leg since last Thursday. Pt believe could be a spider bite.  Pt states that been red and sore since Friday.  Pt hasnt noticed any drainage.

## 2013-11-22 NOTE — Progress Notes (Signed)
[facility] provided pt with a list of primary care resources to help patient establish a pcp.  °

## 2013-11-22 NOTE — Discharge Instructions (Signed)
Cellulitis °Cellulitis is an infection of the skin and the tissue under the skin. The infected area is usually red and tender. This happens most often in the arms and lower legs. °HOME CARE  °· Take your antibiotic medicine as told. Finish the medicine even if you start to feel better. °· Keep the infected arm or leg raised (elevated). °· Put a warm cloth on the area up to 4 times per day. °· Only take medicines as told by your doctor. °· Keep all doctor visits as told. °GET HELP RIGHT AWAY IF:  °· You have a fever. °· You feel very sleepy. °· You throw up (vomit) or have watery poop (diarrhea). °· You feel sick and have muscle aches and pains. °· You see red streaks on the skin coming from the infected area. °· Your red area gets bigger or turns a dark color. °· Your bone or joint under the infected area is painful after the skin heals. °· Your infection comes back in the same area or different area. °· You have a puffy (swollen) bump in the infected area. °· You have new symptoms. °MAKE SURE YOU:  °· Understand these instructions. °· Will watch your condition. °· Will get help right away if you are not doing well or get worse. °Document Released: 11/12/2007 Document Revised: 11/25/2011 Document Reviewed: 08/11/2011 °[employer]® Patient Information ©2014 [employer]. ° °

## 2013-11-22 NOTE — ED Notes (Signed)
Circle drawn around redness; PA told pt to come back if redness increases past marked area

## 2013-11-26 NOTE — ED Provider Notes (Signed)
Medical screening examination/treatment/procedure(s) were performed by non-physician practitioner and as supervising physician I was immediately available for consultation/collaboration.   EKG Interpretation None        Rolland PorterMark James, MD 11/26/13 [phone]

## 2014-04-18 ENCOUNTER — Encounter (HOSPITAL_COMMUNITY): Payer: Self-pay | Admitting: *Deleted

## 2014-04-18 ENCOUNTER — Emergency Department (HOSPITAL_COMMUNITY)
Admission: EM | Admit: 2014-04-18 | Discharge: 2014-04-18 | Disposition: A | Discharge status: Home / Self Care | Payer: Self-pay | Attending: Emergency Medicine | Admitting: Emergency Medicine

## 2014-04-18 DIAGNOSIS — Z8639 Personal history of other endocrine, nutritional and metabolic disease: Secondary | ICD-10-CM | POA: Insufficient documentation

## 2014-04-18 DIAGNOSIS — Z87442 Personal history of urinary calculi: Secondary | ICD-10-CM | POA: Insufficient documentation

## 2014-04-18 DIAGNOSIS — K006 Disturbances in tooth eruption: Secondary | ICD-10-CM | POA: Insufficient documentation

## 2014-04-18 DIAGNOSIS — Z8614 Personal history of Methicillin resistant Staphylococcus aureus infection: Secondary | ICD-10-CM | POA: Insufficient documentation

## 2014-04-18 DIAGNOSIS — Z720 Tobacco use: Secondary | ICD-10-CM | POA: Insufficient documentation

## 2014-04-18 DIAGNOSIS — K0381 Cracked tooth: Secondary | ICD-10-CM | POA: Insufficient documentation

## 2014-04-18 DIAGNOSIS — K0889 Other specified disorders of teeth and supporting structures: Secondary | ICD-10-CM

## 2014-04-18 DIAGNOSIS — K088 Other specified disorders of teeth and supporting structures: Secondary | ICD-10-CM | POA: Insufficient documentation

## 2014-04-18 MED ORDER — TRAMADOL-ACETAMINOPHEN 37.5-325 MG PO TABS: 1.0000 | ORAL_TABLET | Freq: Four times a day (QID) | ORAL | Status: AC | PRN

## 2014-04-18 NOTE — ED Notes (Signed)
C/o R upper and lower tooth pain ("not sure if it is top or bottom, or both), denies sx other than pain. Took ibuprofen 800mg  2 hrs ago (1738). Mentions plan to have teeth pulled in 1 month.

## 2014-04-18 NOTE — ED Notes (Signed)
PA at BS.  

## 2014-04-18 NOTE — Discharge Instructions (Signed)
Take the prescribed medication as directed. Follow-up with dentist.  Referrals provided or you may choose your own. Return to the ED for new or worsening symptoms.

## 2014-04-18 NOTE — ED Provider Notes (Signed)
CSN: [id number]     Arrival date & time 04/18/14  1924 History   First MD Initiated Contact with Patient 04/18/14 2109     Chief Complaint  Patient presents with  . Dental Pain     (Consider location/radiation/quality/duration/timing/severity/associated sxs/prior Treatment) Patient is a 38 y.o. female presenting with tooth pain. The history is provided by the patient and medical records.  Dental Pain   This is a 38 year old female with past medical history significant for thyroid disease, back pain, kidney stones, presenting to the ED for dental pain. Patient states she has "bad teeth" on her right side. States they've been bothering her for the past several weeks, worse with eating or when cold air touches her tooth. She denies any facial swelling, fever, or chills. Patient states she plans to have been pulled next month. She has been taking Motrin at home without significant improvement.    Past Medical History  Diagnosis Date  . MRSA (methicillin resistant Staphylococcus aureus)   . Kidney stone   . Back pain   . Thyroid disease    Past Surgical History  Procedure Laterality Date  . Parathyroidectomy    . Tubal ligation    . Hand surgery     No family history on file. History  Substance Use Topics  . Smoking status: Current Every Day Smoker    Types: Cigarettes    Last Attempt to Quit: 04/17/2013  . Smokeless tobacco: Never Used  . Alcohol Use: No   OB History    No data available     Review of Systems  HENT: Positive for dental problem.   All other systems reviewed and are negative.     Allergies  Review of patient's allergies indicates no known allergies.  Home Medications   Prior to Admission medications   Medication Sig Start Date End Date Taking? Authorizing Provider  clindamycin (CLEOCIN) 150 MG capsule Take 2 capsules (300 mg total) by mouth 4 (four) times daily. X 7 days Patient not taking: Reported on 04/18/2014 11/22/13   Ramon DredgeHannah S Merrell, PA-C    BP 125/48 mmHg  Pulse 92  Temp(Src) 98.2 F (36.8 C) (Oral)  Resp 18  Ht 5\' 9"  (1.753 m)  Wt 185 lb (83.915 kg)  BMI 27.31 kg/m2  SpO2 97%   Physical Exam  Constitutional: She is oriented to person, place, and time. She appears well-developed and well-nourished. No distress.  HENT:  Head: Normocephalic and atraumatic.  Mouth/Throat: Oropharynx is clear and moist.  Teeth largely in poor dentition, right upper and lower molars broken with large cavities present; surrounding gingiva normal in appearance without signs of dental abscess, handling secretions appropriately, no trismus  Eyes: Conjunctivae and EOM are normal. Pupils are equal, round, and reactive to light.  Neck: Normal range of motion. Neck supple.  Cardiovascular: Normal rate, regular rhythm and normal heart sounds.   Pulmonary/Chest: Effort normal and breath sounds normal. No respiratory distress. She has no wheezes.  Musculoskeletal: Normal range of motion.  Neurological: She is alert and oriented to person, place, and time.  Skin: Skin is warm and dry. She is not diaphoretic.  Psychiatric: She has a normal mood and affect.  Nursing note and vitals reviewed.   ED Course  Procedures (including critical care time) Labs Review Labs Reviewed - No data to display  Imaging Review No results found.   EKG Interpretation None      MDM   Final diagnoses:  Pain, dental   Dental pain without  signs of dental abscess. Short supply of pain medication given. Encouraged follow-up with dentist.  Discussed plan with patient, he/she acknowledged understanding and agreed with plan of care.  Return precautions given for new or worsening symptoms.  Garlon HatchetLisa M Reighlynn Swiney, PA-C 04/18/14 2158  Benny LennertJoseph L Zammit, MD 04/19/14 [phone]

## 2015-12-19 IMAGING — CR DG SHOULDER 2+V*R*
3 series · 3 of 3 positions shown · non-contrast
Comparison: 05/13/2013

CLINICAL DATA: Right shoulder pain.

EXAM:
RIGHT SHOULDER - 2+ VIEW

[w shoulder external right]
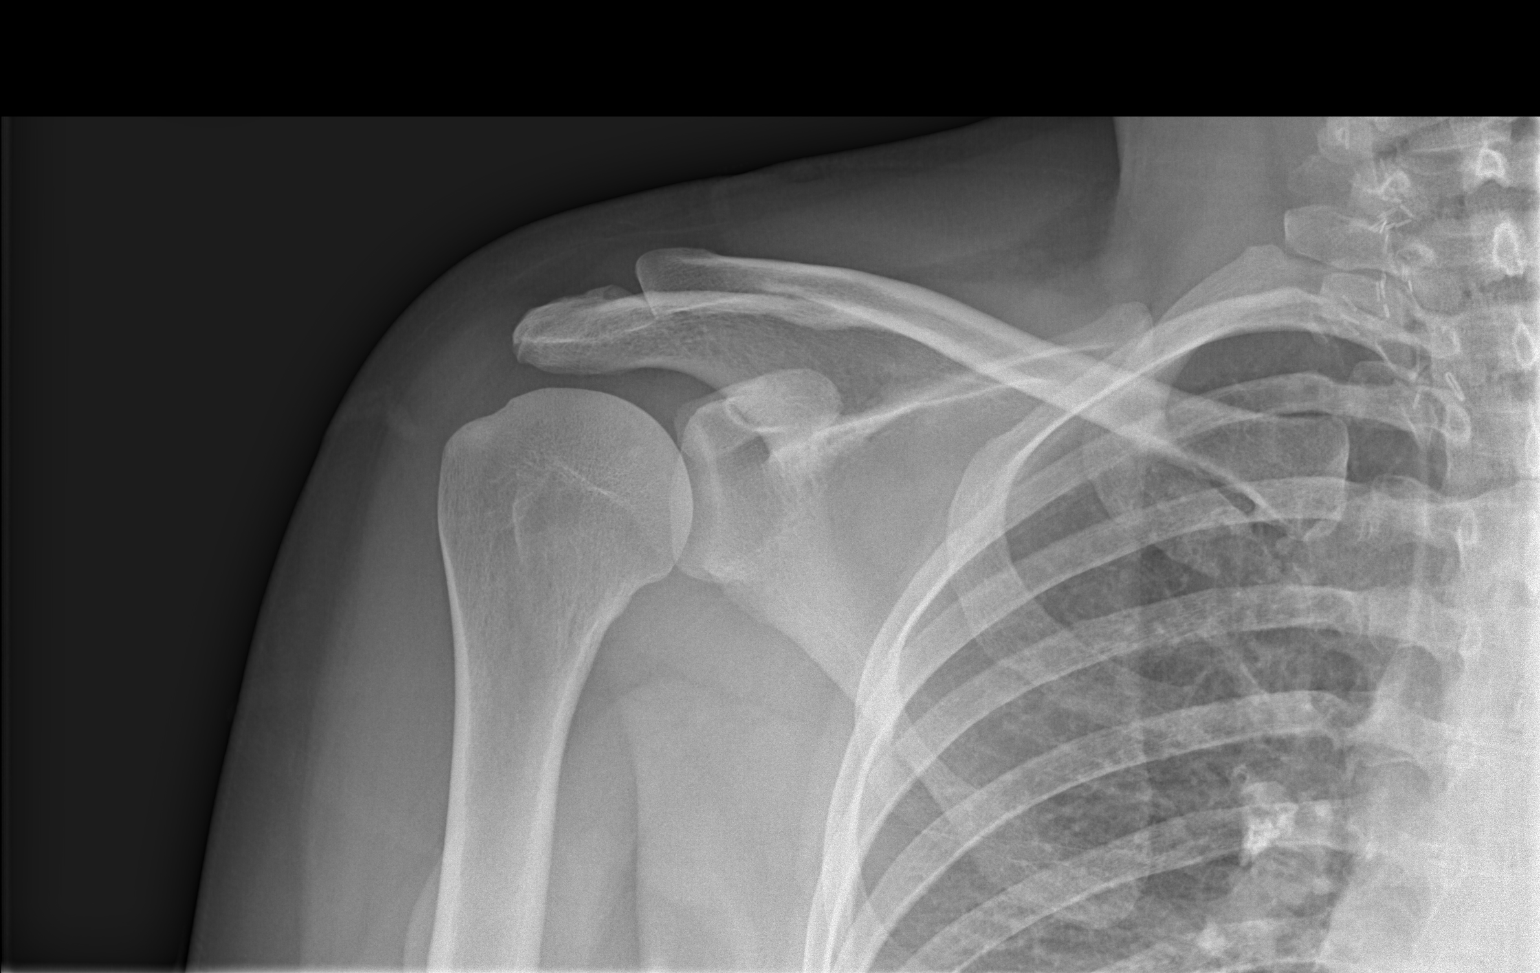

[w shoulder y-view right]
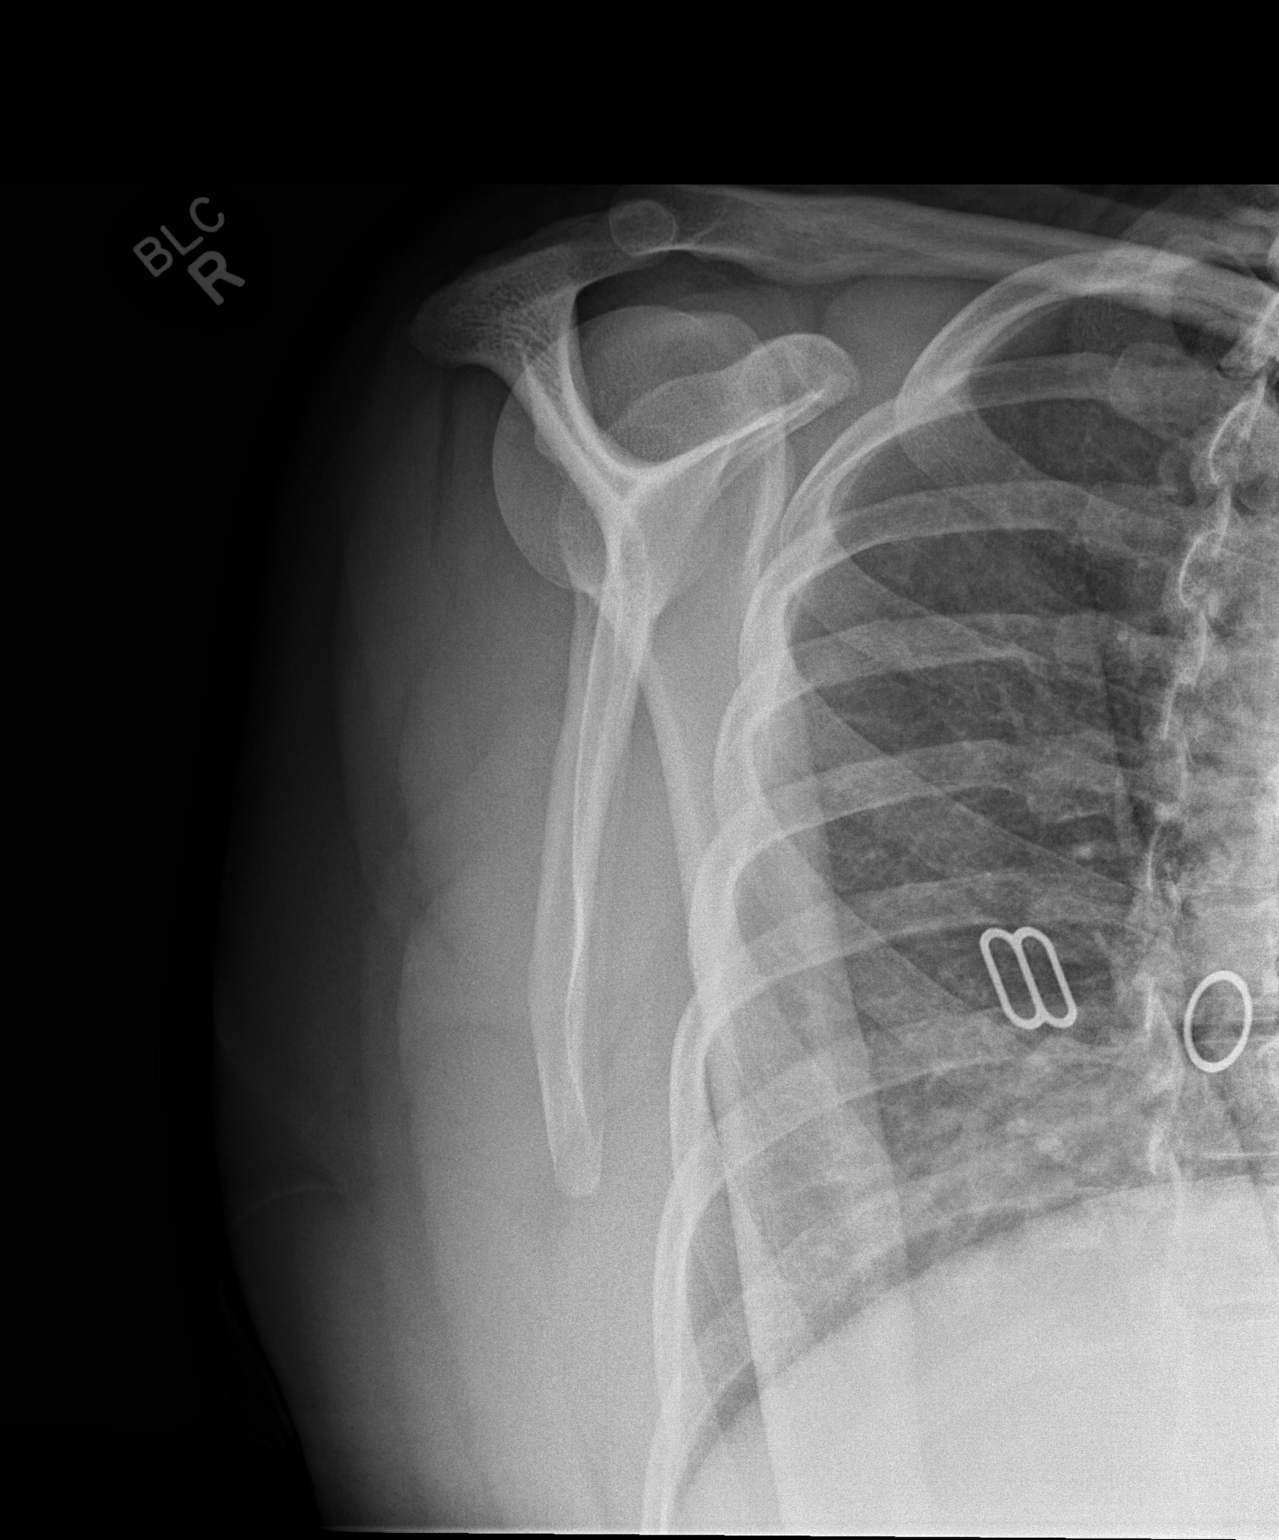

[x shoulder axillary right]
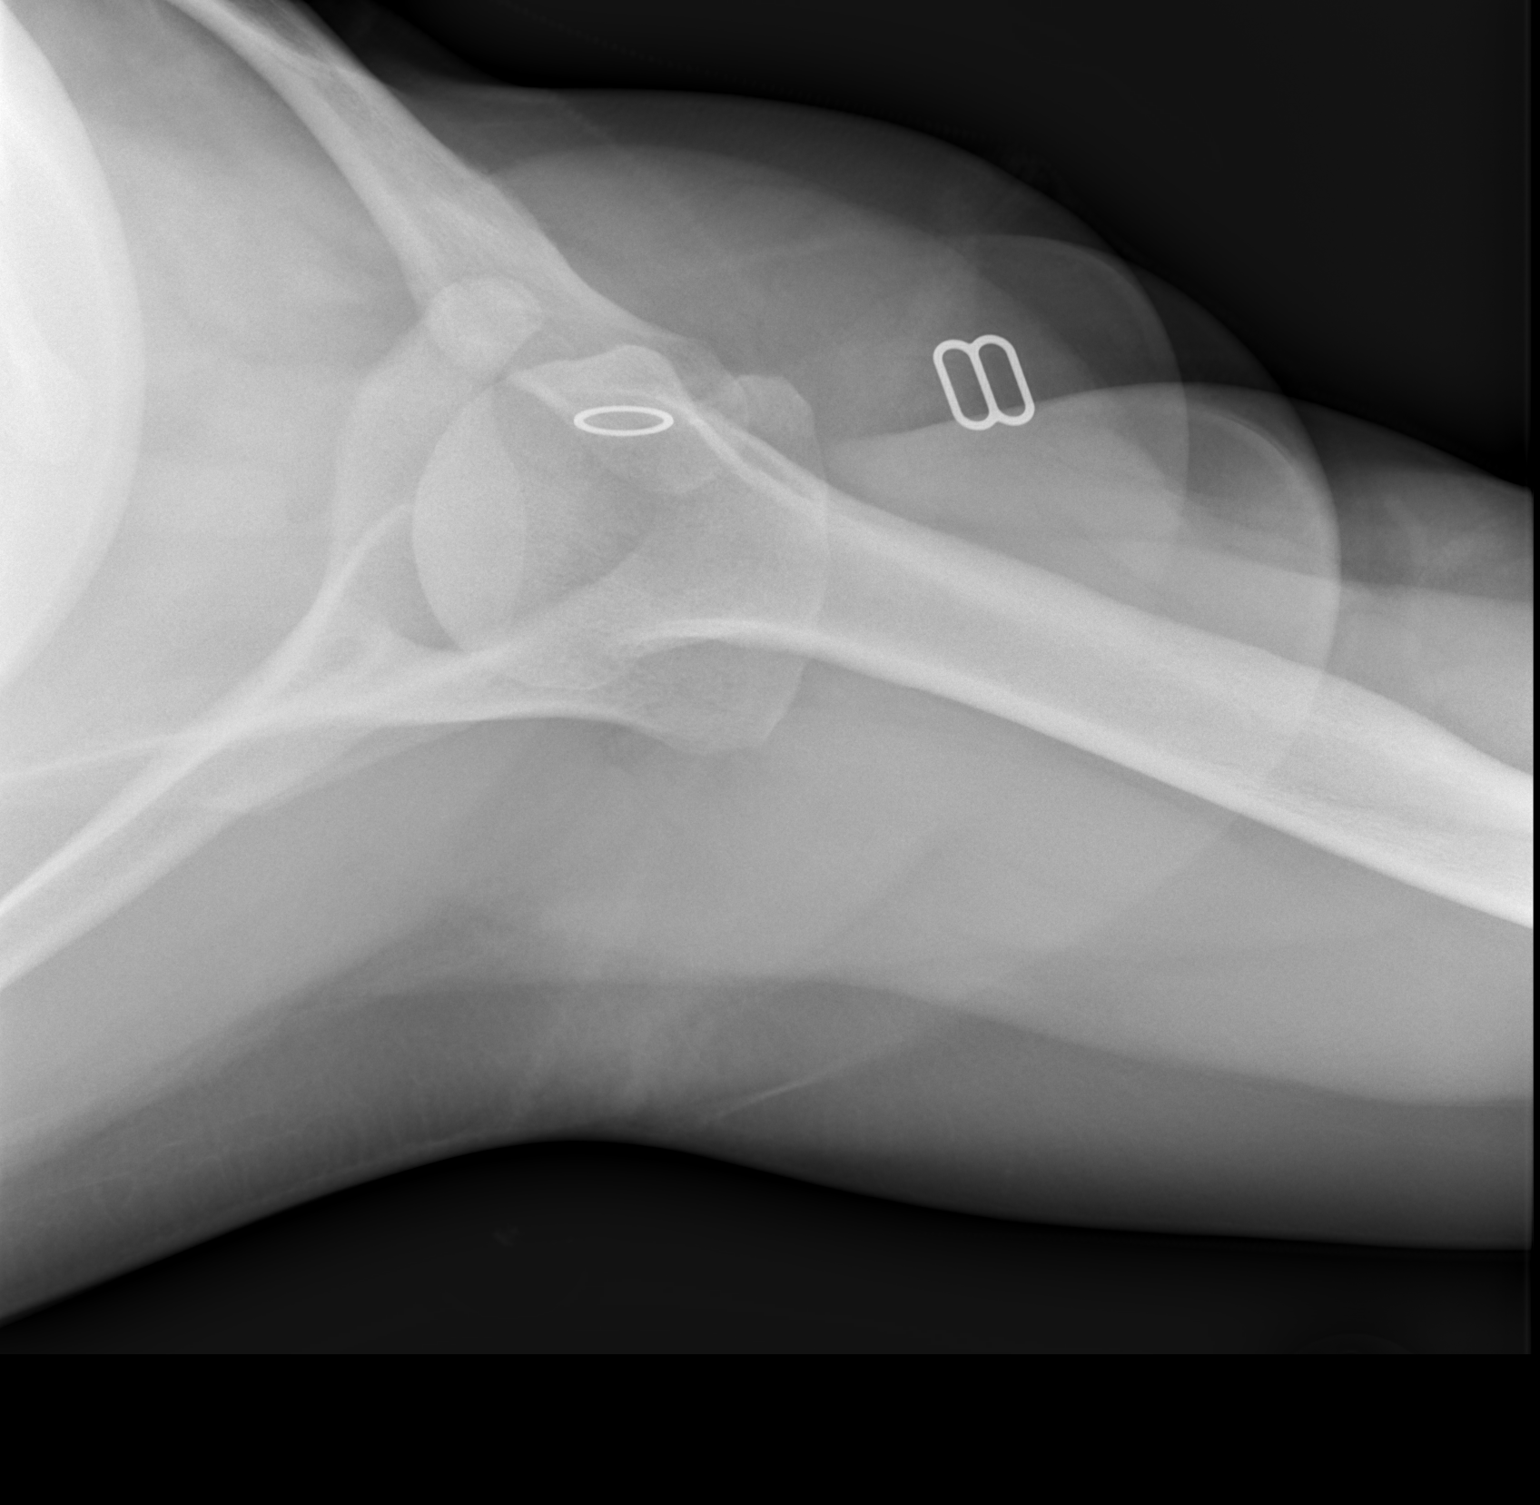

[3 of 3 positions shown; findings below may reference images not displayed]

FINDINGS: Negative for a fracture or dislocation. The right AC joint is
intact. Visualized right ribs are intact.
IMPRESSION: Negative right shoulder examination.
